# Patient Record
Sex: Female | Born: 1965 | Race: White | Hispanic: No | Smoking: Never smoker
Health system: Southern US, Community
[De-identification: ages and names within clinical notes are randomized; demographics above are authoritative.]

## PROBLEM LIST (undated history)

## (undated) DIAGNOSIS — N3281 Overactive bladder: Secondary | ICD-10-CM

## (undated) DIAGNOSIS — Z5189 Encounter for other specified aftercare: Secondary | ICD-10-CM

## (undated) DIAGNOSIS — I10 Essential (primary) hypertension: Secondary | ICD-10-CM

## (undated) DIAGNOSIS — K5792 Diverticulitis of intestine, part unspecified, without perforation or abscess without bleeding: Secondary | ICD-10-CM

## (undated) DIAGNOSIS — R000 Tachycardia, unspecified: Secondary | ICD-10-CM

## (undated) DIAGNOSIS — Z9109 Other allergy status, other than to drugs and biological substances: Secondary | ICD-10-CM

## (undated) DIAGNOSIS — G43109 Migraine with aura, not intractable, without status migrainosus: Secondary | ICD-10-CM

## (undated) HISTORY — DX: Essential (primary) hypertension: I10

## (undated) HISTORY — DX: Migraine with aura, not intractable, without status migrainosus: G43.109

## (undated) HISTORY — DX: Tachycardia, unspecified: R00.0

## (undated) HISTORY — DX: Other allergy status, other than to drugs and biological substances: Z91.09

## (undated) HISTORY — DX: Encounter for other specified aftercare: Z51.89

## (undated) HISTORY — DX: Overactive bladder: N32.81

## (undated) HISTORY — DX: Diverticulitis of intestine, part unspecified, without perforation or abscess without bleeding: K57.92

## (undated) HISTORY — PX: NASAL SINUS SURGERY: SHX719

---

## 1999-05-30 ENCOUNTER — Other Ambulatory Visit: Admission: RE | Admit: 1999-05-30 | Discharge: 1999-05-30 | Payer: Self-pay | Admitting: Obstetrics and Gynecology

## 2000-06-19 ENCOUNTER — Other Ambulatory Visit: Admission: RE | Admit: 2000-06-19 | Discharge: 2000-06-19 | Payer: Self-pay | Admitting: Obstetrics and Gynecology

## 2001-07-09 ENCOUNTER — Other Ambulatory Visit: Admission: RE | Admit: 2001-07-09 | Discharge: 2001-07-09 | Payer: Self-pay | Admitting: Obstetrics and Gynecology

## 2002-02-11 ENCOUNTER — Encounter: Payer: Self-pay | Admitting: Family Medicine

## 2002-02-11 ENCOUNTER — Ambulatory Visit (HOSPITAL_COMMUNITY): Admission: RE | Admit: 2002-02-11 | Discharge: 2002-02-11 | Payer: Self-pay | Admitting: Family Medicine

## 2002-07-22 ENCOUNTER — Other Ambulatory Visit: Admission: RE | Admit: 2002-07-22 | Discharge: 2002-07-22 | Payer: Self-pay | Admitting: Obstetrics and Gynecology

## 2003-07-27 ENCOUNTER — Other Ambulatory Visit: Admission: RE | Admit: 2003-07-27 | Discharge: 2003-07-27 | Payer: Self-pay | Admitting: Obstetrics and Gynecology

## 2004-08-22 ENCOUNTER — Other Ambulatory Visit: Admission: RE | Admit: 2004-08-22 | Discharge: 2004-08-22 | Payer: Self-pay | Admitting: Obstetrics and Gynecology

## 2005-08-28 ENCOUNTER — Other Ambulatory Visit: Admission: RE | Admit: 2005-08-28 | Discharge: 2005-08-28 | Payer: Self-pay | Admitting: Obstetrics and Gynecology

## 2006-11-01 ENCOUNTER — Other Ambulatory Visit: Admission: RE | Admit: 2006-11-01 | Discharge: 2006-11-01 | Payer: Self-pay | Admitting: Obstetrics and Gynecology

## 2007-11-24 ENCOUNTER — Other Ambulatory Visit: Admission: RE | Admit: 2007-11-24 | Discharge: 2007-11-24 | Payer: Self-pay | Admitting: Obstetrics and Gynecology

## 2009-01-05 ENCOUNTER — Other Ambulatory Visit: Admission: RE | Admit: 2009-01-05 | Discharge: 2009-01-05 | Payer: Self-pay | Admitting: Obstetrics and Gynecology

## 2009-02-04 ENCOUNTER — Encounter: Admission: RE | Admit: 2009-02-04 | Discharge: 2009-02-04 | Payer: Self-pay | Admitting: Obstetrics and Gynecology

## 2010-04-21 DIAGNOSIS — Z5189 Encounter for other specified aftercare: Secondary | ICD-10-CM

## 2010-04-21 HISTORY — DX: Encounter for other specified aftercare: Z51.89

## 2010-05-14 ENCOUNTER — Inpatient Hospital Stay (HOSPITAL_COMMUNITY): Admission: AD | Admit: 2010-05-14 | Discharge: 2010-05-17 | Payer: Self-pay | Admitting: Obstetrics and Gynecology

## 2010-05-15 ENCOUNTER — Encounter (INDEPENDENT_AMBULATORY_CARE_PROVIDER_SITE_OTHER): Payer: Self-pay | Admitting: Obstetrics and Gynecology

## 2010-05-23 ENCOUNTER — Ambulatory Visit: Payer: Self-pay | Admitting: Obstetrics and Gynecology

## 2010-05-23 ENCOUNTER — Inpatient Hospital Stay (HOSPITAL_COMMUNITY): Admission: AD | Admit: 2010-05-23 | Discharge: 2010-05-24 | Payer: Self-pay | Admitting: Obstetrics and Gynecology

## 2010-05-23 DIAGNOSIS — O864 Pyrexia of unknown origin following delivery: Secondary | ICD-10-CM

## 2010-11-11 ENCOUNTER — Other Ambulatory Visit: Payer: Self-pay | Admitting: Obstetrics and Gynecology

## 2010-11-11 DIAGNOSIS — Z1231 Encounter for screening mammogram for malignant neoplasm of breast: Secondary | ICD-10-CM

## 2011-01-05 LAB — URINALYSIS, ROUTINE W REFLEX MICROSCOPIC
Bilirubin Urine: NEGATIVE
Glucose, UA: NEGATIVE mg/dL
Ketones, ur: NEGATIVE mg/dL
Nitrite: NEGATIVE
Protein, ur: NEGATIVE mg/dL
Specific Gravity, Urine: 1.01 (ref 1.005–1.030)
Urobilinogen, UA: 0.2 mg/dL (ref 0.0–1.0)
pH: 6 (ref 5.0–8.0)

## 2011-01-05 LAB — URINE MICROSCOPIC-ADD ON

## 2011-01-05 LAB — CBC
HCT: 36.1 % (ref 36.0–46.0)
Hemoglobin: 12.1 g/dL (ref 12.0–15.0)
MCH: 32.1 pg (ref 26.0–34.0)
MCHC: 33.6 g/dL (ref 30.0–36.0)
MCV: 95.6 fL (ref 78.0–100.0)
Platelets: 337 10*3/uL (ref 150–400)
RBC: 3.78 MIL/uL — ABNORMAL LOW (ref 3.87–5.11)
RDW: 13.9 % (ref 11.5–15.5)
WBC: 29.6 10*3/uL — ABNORMAL HIGH (ref 4.0–10.5)

## 2011-01-06 LAB — CBC
HCT: 30.3 % — ABNORMAL LOW (ref 36.0–46.0)
Hemoglobin: 10.4 g/dL — ABNORMAL LOW (ref 12.0–15.0)
MCH: 33.2 pg (ref 26.0–34.0)
MCHC: 33.8 g/dL (ref 30.0–36.0)
MCHC: 34.4 g/dL (ref 30.0–36.0)
MCV: 96.4 fL (ref 78.0–100.0)
RBC: 3.15 MIL/uL — ABNORMAL LOW (ref 3.87–5.11)

## 2011-02-06 ENCOUNTER — Ambulatory Visit: Payer: Self-pay

## 2012-04-01 ENCOUNTER — Other Ambulatory Visit: Payer: Self-pay | Admitting: Obstetrics and Gynecology

## 2012-04-01 DIAGNOSIS — Z1231 Encounter for screening mammogram for malignant neoplasm of breast: Secondary | ICD-10-CM

## 2012-06-22 DIAGNOSIS — G43009 Migraine without aura, not intractable, without status migrainosus: Secondary | ICD-10-CM | POA: Insufficient documentation

## 2012-07-09 ENCOUNTER — Ambulatory Visit
Admission: RE | Admit: 2012-07-09 | Discharge: 2012-07-09 | Disposition: A | Discharge status: Home / Self Care | Payer: BC Managed Care – PPO | Source: Ambulatory Visit | Attending: Obstetrics and Gynecology | Admitting: Obstetrics and Gynecology

## 2012-07-09 DIAGNOSIS — Z1231 Encounter for screening mammogram for malignant neoplasm of breast: Secondary | ICD-10-CM

## 2013-02-18 ENCOUNTER — Other Ambulatory Visit (HOSPITAL_COMMUNITY): Payer: Self-pay | Admitting: Physician Assistant

## 2013-02-18 DIAGNOSIS — R109 Unspecified abdominal pain: Secondary | ICD-10-CM

## 2013-02-19 ENCOUNTER — Ambulatory Visit (HOSPITAL_COMMUNITY)
Admission: RE | Admit: 2013-02-19 | Discharge: 2013-02-19 | Disposition: A | Discharge status: Home / Self Care | Payer: BC Managed Care – PPO | Source: Ambulatory Visit | Attending: Physician Assistant | Admitting: Physician Assistant

## 2013-02-19 DIAGNOSIS — R109 Unspecified abdominal pain: Secondary | ICD-10-CM

## 2013-06-04 ENCOUNTER — Other Ambulatory Visit: Payer: Self-pay

## 2013-06-04 DIAGNOSIS — Z1231 Encounter for screening mammogram for malignant neoplasm of breast: Secondary | ICD-10-CM

## 2013-07-06 ENCOUNTER — Encounter: Payer: Self-pay | Admitting: Obstetrics and Gynecology

## 2013-07-10 ENCOUNTER — Ambulatory Visit: Payer: Self-pay | Admitting: Obstetrics and Gynecology

## 2013-07-10 ENCOUNTER — Encounter: Payer: Self-pay | Admitting: Gynecology

## 2013-07-10 ENCOUNTER — Ambulatory Visit (INDEPENDENT_AMBULATORY_CARE_PROVIDER_SITE_OTHER): Payer: BC Managed Care – PPO | Admitting: Gynecology

## 2013-07-10 ENCOUNTER — Ambulatory Visit
Admission: RE | Admit: 2013-07-10 | Discharge: 2013-07-10 | Disposition: A | Discharge status: Home / Self Care | Payer: BC Managed Care – PPO | Source: Ambulatory Visit

## 2013-07-10 VITALS — BP 112/70 | HR 86 | Resp 18 | Ht 63.0 in | Wt 139.0 lb

## 2013-07-10 DIAGNOSIS — Z01419 Encounter for gynecological examination (general) (routine) without abnormal findings: Secondary | ICD-10-CM

## 2013-07-10 DIAGNOSIS — Z1231 Encounter for screening mammogram for malignant neoplasm of breast: Secondary | ICD-10-CM

## 2013-07-10 DIAGNOSIS — Z124 Encounter for screening for malignant neoplasm of cervix: Secondary | ICD-10-CM

## 2013-07-10 DIAGNOSIS — Z309 Encounter for contraceptive management, unspecified: Secondary | ICD-10-CM

## 2013-07-10 DIAGNOSIS — K5732 Diverticulitis of large intestine without perforation or abscess without bleeding: Secondary | ICD-10-CM

## 2013-07-10 DIAGNOSIS — G43109 Migraine with aura, not intractable, without status migrainosus: Secondary | ICD-10-CM | POA: Insufficient documentation

## 2013-07-10 DIAGNOSIS — Z0000 Encounter for general adult medical examination without abnormal findings: Secondary | ICD-10-CM

## 2013-07-10 LAB — POCT URINALYSIS DIPSTICK
Leukocytes, UA: NEGATIVE
pH, UA: 5

## 2013-07-10 MED ORDER — NORETHINDRONE 0.35 MG PO TABS: 1.0000 | ORAL_TABLET | Freq: Every day | ORAL | Status: DC

## 2013-07-10 NOTE — Patient Instructions (Signed)

## 2013-07-10 NOTE — Progress Notes (Signed)
47 y.o. Divorced Caucasian female   G1P1001 here for annual exam. Pt is currently sexually active.  Pt denies any dyspareunia. Pt is on POP due to migraines with aura, working well.  Pt does not have menses.  Pt does not have hot flashes or vaginal dryness.  No LMP recorded. Patient is not currently having periods (Reason: Oral contraceptives).          Sexually active: yes  The current method of family planning is OCP (estrogen/progesterone).    Exercising: no  The patient does not participate in regular exercise at present. Last pap: 01/05/09- Negative  Alcohol: 3-5 drinks/wk (wine) Tobacco: no BSE: yes Mammogram: today  Hgb: [facility] ; Urine:  Negative    Health Maintenance  Topic Date Due  . Pap Smear  01/06/2012  . Influenza Vaccine  05/22/2013  . Tetanus/tdap  10/23/2019    Family History  Problem Relation Age of Onset  . Cancer Mother     urine cancer  . Diabetes Father     Type 2  . Cancer Father     lung  . Prostate cancer Father   . Osteoporosis Maternal Grandmother   . Lung cancer Maternal Grandfather   . Osteoporosis Paternal Grandmother     There are no active problems to display for this patient.   Past Medical History  Diagnosis Date  . Environmental allergies   . Diverticulitis   . Blood transfusion without reported diagnosis 04/2010    Past Surgical History  Procedure Laterality Date  . Cesarean section  2011    Allergies: Review of patient's allergies indicates no active allergies.  Current Outpatient Prescriptions  Medication Sig Dispense Refill  . DULoxetine (CYMBALTA) 30 MG capsule Take 30 mg by mouth daily.      . norethindrone (ERRIN) 0.35 MG tablet Take 1 tablet by mouth daily.      . predniSONE (DELTASONE) 20 MG tablet       . tiZANidine (ZANAFLEX) 4 MG tablet       . zonisamide (ZONEGRAN) 100 MG capsule Take 100 mg by mouth 2 (two) times daily.      . Acetaminophen (TYLENOL PO) Take by mouth.      . Celecoxib (CELEBREX PO) Take by  mouth.       No current facility-administered medications for this visit.    ROS: Pertinent items are noted in HPI.  Exam:    BP 112/70  Pulse 86  Resp 18  Ht 5\' 3"  (1.6 m)  Wt 139 lb (63.05 kg)  BMI 24.63 kg/m2 Weight change: @WEIGHTCHANGE @ Last 3 height recordings:  Ht Readings from Last 3 Encounters:  07/10/13 5\' 3"  (1.6 m)   General appearance: alert, cooperative and appears stated age Head: Normocephalic, without obvious abnormality, atraumatic Neck: no adenopathy, no carotid bruit, no JVD, supple, symmetrical, trachea midline and thyroid not enlarged, symmetric, no tenderness/mass/nodules Lungs: clear to auscultation bilaterally Breasts: normal appearance, no masses or tenderness Heart: regular rate and rhythm, S1, S2 normal, no murmur, click, rub or gallop Abdomen: soft, non-tender; bowel sounds normal; no masses,  no organomegaly Extremities: extremities normal, atraumatic, no cyanosis or edema Skin: Skin color, texture, turgor normal. No rashes or lesions Lymph nodes: Cervical, supraclavicular, and axillary nodes normal. no inguinal nodes palpated Neurologic: Grossly normal   Pelvic: External genitalia:  no lesions              Urethra: normal appearing urethra with no masses, tenderness or lesions  Bartholins and Skenes: normal                 Vagina: normal appearing vagina with normal color and discharge, no lesions              Cervix: normal appearance              Pap taken: yes        Bimanual Exam:  Uterus:  uterus is normal size, shape, consistency and nontender                                      Adnexa:    normal adnexa in size, nontender and no masses                                      Rectovaginal: Confirms                                      Anus:  normal sphincter tone, no lesions  A: well woman no contraindication to continue use of oral contraceptives Contraceptive management     P: mammogram annual pap smear HRHPV counseled  on breast self exam, mammography screening, use and side effects of OCP's, adequate intake of calcium and vitamin D, diet and exercise return annually or prn   An After Visit Summary was printed and given to the patient.

## 2013-10-20 ENCOUNTER — Other Ambulatory Visit: Payer: Self-pay | Admitting: Otolaryngology

## 2014-06-22 ENCOUNTER — Ambulatory Visit
Admission: RE | Admit: 2014-06-22 | Discharge: 2014-06-22 | Disposition: A | Discharge status: Home / Self Care | Payer: BC Managed Care – PPO | Source: Ambulatory Visit | Attending: Allergy and Immunology | Admitting: Allergy and Immunology

## 2014-06-22 ENCOUNTER — Other Ambulatory Visit: Payer: Self-pay | Admitting: Allergy and Immunology

## 2014-06-22 DIAGNOSIS — R0602 Shortness of breath: Secondary | ICD-10-CM

## 2014-07-08 ENCOUNTER — Other Ambulatory Visit: Payer: Self-pay

## 2014-07-08 DIAGNOSIS — Z1231 Encounter for screening mammogram for malignant neoplasm of breast: Secondary | ICD-10-CM

## 2014-07-12 ENCOUNTER — Ambulatory Visit (INDEPENDENT_AMBULATORY_CARE_PROVIDER_SITE_OTHER): Payer: BC Managed Care – PPO | Admitting: Gynecology

## 2014-07-12 ENCOUNTER — Encounter: Payer: Self-pay | Admitting: Gynecology

## 2014-07-12 VITALS — BP 108/68 | HR 84 | Resp 12 | Ht 63.0 in | Wt 146.0 lb

## 2014-07-12 DIAGNOSIS — Z01419 Encounter for gynecological examination (general) (routine) without abnormal findings: Secondary | ICD-10-CM

## 2014-07-12 DIAGNOSIS — Z0000 Encounter for general adult medical examination without abnormal findings: Secondary | ICD-10-CM

## 2014-07-12 DIAGNOSIS — Z3041 Encounter for surveillance of contraceptive pills: Secondary | ICD-10-CM

## 2014-07-12 LAB — POCT URINALYSIS DIPSTICK
LEUKOCYTES UA: NEGATIVE
PH UA: 5
Urobilinogen, UA: NEGATIVE

## 2014-07-12 MED ORDER — NORETHINDRONE 0.35 MG PO TABS
1.0000 | ORAL_TABLET | Freq: Every day | ORAL | Status: DC
Start: 2014-07-12 — End: 2014-12-07

## 2014-07-12 MED ORDER — NORETHINDRONE 0.35 MG PO TABS: 1.0000 | ORAL_TABLET | Freq: Every day | ORAL | Status: DC

## 2014-07-12 NOTE — Progress Notes (Signed)
48 y.o. Divorced Caucasian female   G1P1001 here for annual exam. Pt is currently sexually active.  Pt reports on occassions running out of pill and extreme PMS, some hot flashes. migraines still occuring.  No dyspareunia. Pt reports new onset of hemorrhoids-used otc ointment with some relief.  Patient's last menstrual period was 06/28/2014.          Sexually active: Yes.    The current method of family planning is OCP (estrogen/progesterone).    Exercising: No.  The patient does not participate in regular exercise at present. Last pap: 07/10/13 NEG HR HPV Alcohol: 3-5 drinks/wk Tobacco: no BSE: no Mammogram: 07/13/13 Bi-Rads 1 ; scheduled for 07/30/14   Labs: Work ; Urine: Negative    Health Maintenance  Topic Date Due  . Influenza Vaccine  05/22/2014  . Pap Smear  07/10/2016  . Tetanus/tdap  10/23/2019    Family History  Problem Relation Age of Onset  . Cancer Mother     urine cancer  . Diabetes Father     Type 2  . Cancer Father     lung  . Prostate cancer Father   . Osteoporosis Maternal Grandmother   . Lung cancer Maternal Grandfather   . Osteoporosis Paternal Grandmother     Patient Active Problem List   Diagnosis Date Noted  . Migraine with aura 07/10/2013  . Diverticulitis of colon (without mention of hemorrhage) 07/10/2013    Past Medical History  Diagnosis Date  . Environmental allergies   . Diverticulitis   . Blood transfusion without reported diagnosis 04/2010    Past Surgical History  Procedure Laterality Date  . Cesarean section  2011  . Nasal sinus surgery      Allergies: Review of patient's allergies indicates no known allergies.  Current Outpatient Prescriptions  Medication Sig Dispense Refill  . Acetaminophen (TYLENOL PO) Take by mouth.      . Celecoxib (CELEBREX PO) Take by mouth.      . DULoxetine (CYMBALTA) 30 MG capsule Take 30 mg by mouth daily.      . norethindrone (ERRIN) 0.35 MG tablet Take 1 tablet (0.35 mg total) by mouth daily.   3 Package  3  . predniSONE (DELTASONE) 20 MG tablet       . tiZANidine (ZANAFLEX) 4 MG tablet       . zonisamide (ZONEGRAN) 100 MG capsule Take 100 mg by mouth 2 (two) times daily.       No current facility-administered medications for this visit.    ROS: Pertinent items are noted in HPI.  Exam:    BP 108/68  Pulse 84  Resp 12  Ht  (1.6 m)  Wt 146 lb (66.225 kg)  BMI 25.87 kg/m2  LMP 06/28/2014 Weight change: @ Last 3 height recordings:  Ht Readings from Last 3 Encounters:  07/12/14  (1.6 m)  07/10/13  (1.6 m)   General appearance: alert, cooperative and appears stated age Head: Normocephalic, without obvious abnormality, atraumatic Neck: no adenopathy, no carotid bruit, no JVD, supple, symmetrical, trachea midline and thyroid not enlarged, symmetric, no tenderness/mass/nodules Lungs: clear to auscultation bilaterally Breasts: normal appearance, no masses or tenderness Heart: regular rate and rhythm, S1, S2 normal, no murmur, click, rub or gallop Abdomen: soft, non-tender; bowel sounds normal; no masses,  no organomegaly Extremities: extremities normal, atraumatic, no cyanosis or edema Skin: Skin color, texture, turgor normal. No rashes or lesions Lymph nodes: Cervical, supraclavicular, and axillary nodes normal. no inguinal nodes palpated Neurologic:  Grossly normal   Pelvic: External genitalia:  no lesions              Urethra: normal appearing urethra with no masses, tenderness or lesions              Bartholins and Skenes: Bartholin's, Urethra, Skene's normal                 Vagina: normal appearing vagina with normal color and discharge, no lesions              Cervix: normal appearance              Pap taken: No.        Bimanual Exam:  Uterus:  uterus is normal size, shape, consistency and nontender                                      Adnexa:    normal adnexa in size, nontender and no masses                                       Rectovaginal: Confirms                                      Anus:  normal sphincter tone, no lesions      1. Routine gynecological examination counseled on breast self exam, mammography screening, menopause, adequate intake of calcium and vitamin D, diet and exercise return annually or prn    2. Laboratory examination ordered as part of a routine general medical examination  - POCT Urinalysis Dipstick  3. Encounter for surveillance of contraceptive pills  - norethindrone (ERRIN) 0.35 MG tablet; Take 1 tablet (0.35 mg total) by mouth daily.  Dispense: 1 Package; Refill: 11  An After Visit Summary was printed and given to the patient.

## 2014-07-30 ENCOUNTER — Ambulatory Visit: Payer: BC Managed Care – PPO

## 2014-08-12 ENCOUNTER — Ambulatory Visit
Admission: RE | Admit: 2014-08-12 | Discharge: 2014-08-12 | Disposition: A | Discharge status: Home / Self Care | Payer: BC Managed Care – PPO | Source: Ambulatory Visit

## 2014-08-12 DIAGNOSIS — Z1231 Encounter for screening mammogram for malignant neoplasm of breast: Secondary | ICD-10-CM

## 2014-08-23 ENCOUNTER — Encounter: Payer: Self-pay | Admitting: Gynecology

## 2014-09-22 ENCOUNTER — Telehealth: Payer: Self-pay | Admitting: Gynecology

## 2014-09-22 NOTE — Telephone Encounter (Signed)
Left message regarding upcoming appointment has been canceled and needs to be rescheduled. °

## 2014-11-29 ENCOUNTER — Telehealth: Payer: Self-pay | Admitting: Nurse Practitioner

## 2014-11-29 NOTE — Telephone Encounter (Signed)
Spoke with patient. Patient states that she has stopped taking her POP and has not had any bleeding. Patient states she has been off of birth control for weeks. "I know when I saw Dr.Lathrop last she asked if I have bleeding when I am off of the pill because I will randomly stop taking it. At that time I was but now I am not. I do not know if I need to come in or what. I am not sure if I am going through menopause or not." Advised will need to be seen in office to discuss and have any lab work done that may be needed. Patient is agreeable. Requesting appointment on 2/16. Appointment scheduled for 2/16 at 2:15pm. Patient is agreeable to date and time.  Routing to provider for final review. Patient agreeable to disposition. Will close encounter

## 2014-11-29 NOTE — Telephone Encounter (Signed)
Patient calling requesting to speak with the nurse about "possible testing for menopause."

## 2014-12-07 ENCOUNTER — Ambulatory Visit (INDEPENDENT_AMBULATORY_CARE_PROVIDER_SITE_OTHER): Payer: BLUE CROSS/BLUE SHIELD | Admitting: Nurse Practitioner

## 2014-12-07 ENCOUNTER — Encounter: Payer: Self-pay | Admitting: Nurse Practitioner

## 2014-12-07 VITALS — BP 120/82 | HR 84 | Ht 63.0 in | Wt 146.0 lb

## 2014-12-07 DIAGNOSIS — Z3041 Encounter for surveillance of contraceptive pills: Secondary | ICD-10-CM

## 2014-12-07 MED ORDER — NORETHINDRONE 0.35 MG PO TABS: 1.0000 | ORAL_TABLET | Freq: Every day | ORAL | Status: DC

## 2014-12-07 NOTE — Patient Instructions (Signed)
Continue POP and let us know if prolonged bleeing Report any changes in mood

## 2014-12-07 NOTE — Telephone Encounter (Signed)
Left message to call Pellegrino Kennard at [phone]. 

## 2014-12-07 NOTE — Telephone Encounter (Signed)
Spoke with patient. Patient states that she started her cycle on 2/11. Patient is asking if she needs to keep appointment for today. "I just don't know if I still need the blood work or not. Can I stay on the birth control? Will it cause any harm if I am going into menopause?" Advised patient we recommend that she have follow up after three months with no cycle. Advised can keep appointment today to have labs and speak with [name], FNP about continuing birth control if she would like. Patient is agreeable. Will keep appointment scheduled for today.  Routing to provider for final review. Patient agreeable to disposition. Will close encounter

## 2014-12-07 NOTE — Progress Notes (Signed)
Patient ID: Nancy Brewer, female   DOB: 10/23/1965, 49 y.o.   MRN: [id number] S: This 49 yo WM Fe G1P1 presents with a history of increase in mood changes and some vaso symptoms over the past several months.  She had been on POP for birth control with a history of migraine with aura.  Did well and had amenorrhea for the most part while on POP.    She states she just got 'lazy' about 6 wk's ago and did not go and get RX.  Did not notice a real hormonal change at that time.  Rarely SA so did not consider birth control options.  She then had a normal menses on her own on 12/02/14.  Flow was normal and lasted for 3 days.  She feels more emotional in that she gets tense and snappy more than usual.  She has noted some weight gain and fatigue.  She sought care at PCP and all labs including TSH was normal.  She has so many concerns about HRT.  Also her mother had uterine cancer.  A: Off POP  Regular menses 12/02/14   Mood changes  History of migraine HA's with Aura  Plan: Discussed off POP she has no birth control in place  Discussed that she could go back to history of menorrhagia and dysmenorrhea  With having irregular menses also at risk of endometrial hyperplasia.  She is willing to go back on POP and monitor symptoms until AEX in September.   She may add soy to help her with vaso symptoms  She will add Vit B complex to help her with mood changes   Consult time was 15 minutes face to face.

## 2014-12-07 NOTE — Telephone Encounter (Signed)
Patient has an appointment today at 2:15 with Shirlyn GoltzPatty Grubb. Patient says she started her period and need to talk to a nurse before coming to this appointment.

## 2014-12-12 NOTE — Progress Notes (Signed)
Encounter reviewed by Dr. Supriya Beaston Silva.  

## 2015-04-29 ENCOUNTER — Telehealth: Payer: Self-pay | Admitting: Nurse Practitioner

## 2015-04-29 MED ORDER — NITROFURANTOIN MONOHYD MACRO 100 MG PO CAPS: 100.0000 mg | ORAL_CAPSULE | Freq: Two times a day (BID) | ORAL | Status: DC

## 2015-04-29 NOTE — Telephone Encounter (Signed)
Patient returned call.  She states she has been experiencing an increase in frequency of UTI's with last treatment two months ago at [facility]. Patient states "I am sure I have a UTI, I get them all the time." Patient states that her symptoms started one hour ago and feels spasms at the end of her stream, denies dysuria, but patient states that the dysuria usually occurs a few hours into beginning of symptoms. Denies fevers or flank pain. Reports Macrobid works well for her. Allergies confirmed, no known drug allergies. Patient agreeable to appointment for follow up, requests possible referral to urology for evaluation of ongoing infections.  Advised patient would review with [name], FNP and return call. Patient agreeable.

## 2015-04-29 NOTE — Telephone Encounter (Signed)
Call to patient. Message from [name], FNP given. Patient agreeable and expresses gratitude for treatment prior to the weekend. Patient would like to make appointment to discuss recent frequent infections and plan of care going forward. She requests office visit for 05/13/15, however, Patty not in office. Patient agreeable to office visit with Dr. Edward JollySilva and appointment for 05/13/15 and patient scheduled. Advised to return call if symptoms worsen or any concerns. Patient agreeable.   Routing to provider for final review. Patient agreeable to disposition. Will close encounter.

## 2015-04-29 NOTE — Telephone Encounter (Signed)
Message left to return call to [facility] at [phone].  Patient with hx UTI's in hard copy chart.

## 2015-04-29 NOTE — Telephone Encounter (Signed)
Patient calling with symptoms of a uti. Offered her 4:00 this afternoon with [place] and would not be able to make appointment in time. Wondering if anything can be called in.

## 2015-04-29 NOTE — Telephone Encounter (Signed)
She can have Macrobid 100 mg BID # 14 and recheck urine C&S with a nurse visit in 2 weeks.

## 2015-05-13 ENCOUNTER — Ambulatory Visit (INDEPENDENT_AMBULATORY_CARE_PROVIDER_SITE_OTHER): Payer: BLUE CROSS/BLUE SHIELD | Admitting: Obstetrics and Gynecology

## 2015-05-13 ENCOUNTER — Encounter: Payer: Self-pay | Admitting: Obstetrics and Gynecology

## 2015-05-13 VITALS — BP 132/84 | HR 76 | Resp 16 | Ht 63.0 in | Wt 148.8 lb

## 2015-05-13 DIAGNOSIS — Z87440 Personal history of urinary (tract) infections: Secondary | ICD-10-CM

## 2015-05-13 DIAGNOSIS — R102 Pelvic and perineal pain: Secondary | ICD-10-CM

## 2015-05-13 DIAGNOSIS — N9489 Other specified conditions associated with female genital organs and menstrual cycle: Secondary | ICD-10-CM | POA: Diagnosis not present

## 2015-05-13 LAB — POCT URINALYSIS DIPSTICK
BILIRUBIN UA: NEGATIVE
Blood, UA: NEGATIVE
Glucose, UA: NEGATIVE
Ketones, UA: NEGATIVE
Leukocytes, UA: NEGATIVE
Nitrite, UA: NEGATIVE
Protein, UA: NEGATIVE
UROBILINOGEN UA: NEGATIVE
pH, UA: 5

## 2015-05-13 NOTE — Progress Notes (Signed)
Patient ID: Nancy Brewer, female   DOB: 06-20-1966, 49 y.o.   MRN: [id number] GYNECOLOGY  VISIT   HPI: 49 y.o.   Divorced  Caucasian  female   G1P1001 with No LMP recorded.   here for evaluation of frequent urinary tract infections.  Patient states has had 5-6 UTIs in past year.   Long history of UTIs life long.  Since April 2015 having UTI every 3 months.  Is being seen somewhere for these - going to Urgent Care or sees her NP at work, [employer] in [place].  Has positive cultures, sometimes with resistance to Cipro.   Had UTI 04/29/15 and was treated by phone.  Took Macrobid 100 mg po bid for one week.  Symptoms resolved.   With UTIs feels pressure when bladder is full. Goes for very long periods of time without voiding and then feels feels pain and clenching with voiding which is her clue that she has a UTI.  No hx of stones or pyelonephritis.  No urologic surgery.  UTIs are not necessarily related to intercourse.  No prior urologic evaluation.   Is sexually active but not often.   Partner is older and has 47 year old child. No vaginal dryness.  No menses on Micronor.  Occasional hot flashes.   Has diarrhea with diverticulitis.  Takes Zyfaxan for this when it occurs.  Not certain that her UTIs are related to this.   Urine Dip: Neg  GYNECOLOGIC HISTORY: No LMP recorded. Contraception: Micronor Menopausal hormone therapy: n/a Last mammogram: 08-12-14 Density Cat:C:/Neg:[facility]. Last pap smear: 07-01-13 neg:neg HR HPV        OB History    Gravida Para Term Preterm AB TAB SAB Ectopic Multiple Living   _0 Patient Active Problem List   Diagnosis Date Noted  . Migraine with aura 07/10/2013  . Diverticulitis of colon (without mention of hemorrhage) 07/10/2013  . Atypical migraine 06/22/2012    Past Medical History  Diagnosis Date  . Environmental allergies   . Diverticulitis   . Blood transfusion without reported diagnosis 04/2010     Past Surgical History  Procedure Laterality Date  . Cesarean section  2011  . Nasal sinus surgery      Current Outpatient Prescriptions  Medication Sig Dispense Refill  . DULoxetine (CYMBALTA) 30 MG capsule Take 30 mg by mouth daily.    . naproxen (NAPROSYN) 500 MG tablet Take 500 mg by mouth as needed.     . norethindrone (ERRIN) 0.35 MG tablet Take 1 tablet (0.35 mg total) by mouth daily. 3 Package 2  . rizatriptan (MAXALT) 10 MG tablet Take 10 mg by mouth every 2 (two) hours as needed. (max 3/24 hours)    . tiZANidine (ZANAFLEX) 4 MG tablet     . zonisamide (ZONEGRAN) 100 MG capsule Take 100 mg by mouth 2 (two) times daily.    Marland Kitchen XIFAXAN 550 MG TABS tablet Take 1 tablet by mouth 2 (two) times daily. Takes prn only  0   No current facility-administered medications for this visit.     ALLERGIES: Review of patient's allergies indicates no known allergies.  Family History  Problem Relation Age of Onset  . Cancer Mother     urine cancer  . Diabetes Father     Type 2  . Cancer Father     metastatic lung  . Prostate cancer Father   .  Cancer - Other Father     Squamous Cell of mouth x2  . Osteoporosis Maternal Grandmother   . Lung cancer Maternal Grandfather   . Osteoporosis Paternal Grandmother   . Other      Dad with PDL1 Receptor, which inhibits T-cells (per pt.)    History   Social History  . Marital Status: Divorced    Spouse Name: N/A  . Number of Children: N/A  . Years of Education: N/A   Occupational History  . Not on file.   Social History Main Topics  . Smoking status: Never Smoker   . Smokeless tobacco: Never Used  . Alcohol Use: 1.8 - 3.0 oz/week    3-5 Glasses of wine per week  . Drug Use: No  . Sexual Activity:    Partners: Male    Birth Control/ Protection: Pill   Other Topics Concern  . Not on file   Social History Narrative    ROS:  Pertinent items are noted in HPI.  PHYSICAL EXAMINATION:    BP 132/84 mmHg  Pulse 76  Resp 16   Ht _0  (1.6 m)  Wt 148 lb 12.8 oz (67.495 kg)  BMI 26.37 kg/m2    General appearance: alert, cooperative and appears stated age   Abdomen: soft, non-tender; bowel sounds normal; no masses,  no organomegaly   Pelvic: External genitalia:  no lesions              Urethra:  normal appearing urethra with no masses, tenderness or lesions              Bartholins and Skenes: normal                 Vagina: normal appearing vagina with normal color and discharge, no lesions              Cervix: no lesions            Bimanual Exam:  Uterus:  normal size, contour, position, consistency, mobility, non-tender              Adnexa: normal adnexa and no mass, fullness, tenderness      Chaperone was present for exam.  ASSESSMENT  Recurrent UTIs life long. No evidence of pelvic organ prolapse.  Hx of diverticulitis and diarrhea.  Father with PDL1 receptor - inhibits T cells.  PLAN  Counseled regarding recurrent UTIs.  Discussed urologic evaluation.  Will refer patient to Dr. Kathyrn Lass in [place]. Discussed cranberry as a way to reduce UTIs. Will send UC today for a test of cure.  An After Visit Summary was printed and given to the patient.  ___25___ minutes face to face time of which over 50% was spent in counseling.

## 2015-05-14 LAB — URINE CULTURE
COLONY COUNT: NO GROWTH
Organism ID, Bacteria: NO GROWTH

## 2015-05-16 ENCOUNTER — Telehealth: Payer: Self-pay

## 2015-05-16 NOTE — Telephone Encounter (Signed)
-----   Message from Brook E Amundson C Silva, MD sent at 05/15/2015  9:40 PM EDT ----- Please inform patient of her negative urine culture.  Her infection has been treated.  I did make a referral for her to see Dr. Albertson, urology,  for recurrent UTIs. 

## 2015-05-16 NOTE — Telephone Encounter (Signed)
Called patient to discuss results of urine culture at [phone], LMOVM to call me back.

## 2015-05-19 NOTE — Telephone Encounter (Signed)
Called patient and LMOVM to call me back at #[phone].

## 2015-05-19 NOTE — Telephone Encounter (Signed)
-----   Message from Patton Salles, MD sent at 05/15/2015  9:40 PM EDT ----- Please inform patient of her negative urine culture.  Her infection has been treated.  I did make a referral for her to see Dr. Beverely Pace, urology,  for recurrent UTIs.

## 2015-05-23 NOTE — Telephone Encounter (Signed)
See 05-13-15 result note.

## 2015-05-25 ENCOUNTER — Telehealth: Payer: Self-pay | Admitting: Obstetrics and Gynecology

## 2015-05-25 NOTE — Telephone Encounter (Signed)
Ok to close

## 2015-05-25 NOTE — Telephone Encounter (Signed)
Left voicemail regarding referral appointment. The information is listed below. Should the patient need to cancel or reschedule this appointment, please advise them to call the office they've been referred to in order to reschedule.  [facility]            Dr Wilburn Mylar          07/11/15  am with 930am arrival. Please bring insurance card/photo id/medication list [facility] [address] Phone: [phone]

## 2015-05-25 NOTE — Telephone Encounter (Signed)
Patient returning call. Gave patient referral information as listed.

## 2015-07-18 ENCOUNTER — Ambulatory Visit: Payer: Self-pay | Admitting: Nurse Practitioner

## 2015-07-18 ENCOUNTER — Ambulatory Visit: Payer: BC Managed Care – PPO | Admitting: Gynecology

## 2015-08-22 ENCOUNTER — Other Ambulatory Visit: Payer: Self-pay | Admitting: Nurse Practitioner

## 2015-08-22 NOTE — Telephone Encounter (Signed)
Medication refill request: Deblitane  Last AEX:  07-12-14 Next AEX: 08-26-15 Last MMG (if hormonal medication request): 08-13-14 WNL Refill authorized: Please advise

## 2015-08-26 ENCOUNTER — Ambulatory Visit (INDEPENDENT_AMBULATORY_CARE_PROVIDER_SITE_OTHER): Payer: BLUE CROSS/BLUE SHIELD | Admitting: Nurse Practitioner

## 2015-08-26 ENCOUNTER — Encounter: Payer: Self-pay | Admitting: Nurse Practitioner

## 2015-08-26 VITALS — BP 130/88 | HR 72 | Ht 62.75 in | Wt 147.0 lb

## 2015-08-26 DIAGNOSIS — Z3041 Encounter for surveillance of contraceptive pills: Secondary | ICD-10-CM

## 2015-08-26 DIAGNOSIS — Z0000 Encounter for general adult medical examination without abnormal findings: Secondary | ICD-10-CM | POA: Diagnosis not present

## 2015-08-26 DIAGNOSIS — Z01419 Encounter for gynecological examination (general) (routine) without abnormal findings: Secondary | ICD-10-CM

## 2015-08-26 LAB — POCT URINALYSIS DIPSTICK
Bilirubin, UA: NEGATIVE
Blood, UA: NEGATIVE
Glucose, UA: NEGATIVE
Ketones, UA: NEGATIVE
LEUKOCYTES UA: NEGATIVE
Nitrite, UA: NEGATIVE
PROTEIN UA: NEGATIVE
UROBILINOGEN UA: NEGATIVE
pH, UA: 6

## 2015-08-26 MED ORDER — NORETHINDRONE 0.35 MG PO TABS: 1.0000 | ORAL_TABLET | Freq: Every day | ORAL | Status: DC

## 2015-08-26 NOTE — Patient Instructions (Signed)

## 2015-08-26 NOTE — Progress Notes (Signed)
Patient ID: Nancy Brewer, female   DOB: 03-30-1966, 49 y.o.   MRN: [id number] 49 y.o. G47P1001 Divorced  Caucasian Fe here for annual exam.  Menses are now absent since 12/02/14 on POP.  Her symptoms of PMS and perimenopausal changes are much better now that sh is back on POP.  Same partner for 10 years and rarely SA.  Patient's last menstrual period was 12/02/2014 (exact date).          Sexually active: No.  The current method of family planning is abstinence.    Exercising: No.  The patient does not participate in regular exercise at present. Smoker:  no  Health Maintenance: Pap: 07/10/13, Negative with neg HR HPV MMG: 08/12/14, Bi-Rads 1: Negative  TDaP: 10/22/09 Labs:  PCP   Urine: Negative    reports that she has never smoked. She has never used smokeless tobacco. She reports that she drinks about 1.8 - 3.0 oz of alcohol per week. She reports that she does not use illicit drugs.  Past Medical History  Diagnosis Date  . Environmental allergies   . Diverticulitis   . Blood transfusion without reported diagnosis 04/2010    Past Surgical History  Procedure Laterality Date  . Cesarean section  2011  . Nasal sinus surgery      Current Outpatient Prescriptions  Medication Sig Dispense Refill  . DULoxetine (CYMBALTA) 60 MG capsule Take 60 mg by mouth daily.  0  . naproxen (NAPROSYN) 500 MG tablet Take 500 mg by mouth as needed.     . norethindrone (DEBLITANE) 0.35 MG tablet Take 1 tablet (0.35 mg total) by mouth daily. 3 Package 4  . oxybutynin (DITROPAN) 5 MG tablet Take 1 tablet by mouth 3 (three) times daily as needed.  0  . rizatriptan (MAXALT) 10 MG tablet Take 10 mg by mouth every 2 (two) hours as needed. (max 3/24 hours)    . tiZANidine (ZANAFLEX) 4 MG tablet     . zonisamide (ZONEGRAN) 100 MG capsule Take 100 mg by mouth 2 (two) times daily.    Marland Kitchen XIFAXAN 550 MG TABS tablet Take 1 tablet by mouth 2 (two) times daily. Takes prn only  0   No current facility-administered  medications for this visit.    Family History  Problem Relation Age of Onset  . Cancer Mother     urine cancer  . Diabetes Father     Type 2  . Cancer Father     metastatic lung  . Prostate cancer Father   . Cancer - Other Father     Squamous Cell of mouth x2  . Osteoporosis Maternal Grandmother   . Lung cancer Maternal Grandfather   . Osteoporosis Paternal Grandmother   . Other      Dad with PDL1 Receptor, which inhibits T-cells (per pt.)    ROS:  Pertinent items are noted in HPI.  Otherwise, a comprehensive ROS was negative.  Exam:   BP 130/88 mmHg  Pulse 72  Ht 5' 2.75" (1.594 m)  Wt 147 lb (66.679 kg)  BMI 26.24 kg/m2  LMP 12/02/2014 (Exact Date) Height: 5' 2.75" (159.4 cm) Ht Readings from Last 3 Encounters:  08/26/15 5' 2.75" (1.594 m)  05/13/15 5' 3"  (1.6 m)  12/07/14 5' 3"  (1.6 m)    General appearance: alert, cooperative and appears stated age Head: Normocephalic, without obvious abnormality, atraumatic Neck: no adenopathy, supple, symmetrical, trachea midline and thyroid normal to inspection and palpation Lungs: clear to auscultation bilaterally Breasts: normal  appearance, no masses or tenderness Heart: regular rate and rhythm Abdomen: soft, non-tender; no masses,  no organomegaly Extremities: extremities normal, atraumatic, no cyanosis or edema Skin: Skin color, texture, turgor normal. No rashes or lesions Lymph nodes: Cervical, supraclavicular, and axillary nodes normal. No abnormal inguinal nodes palpated Neurologic: Grossly normal   Pelvic: External genitalia:  no lesions              Urethra:  normal appearing urethra with no masses, tenderness or lesions              Bartholin's and Skene's: normal                 Vagina: normal appearing vagina with normal color and discharge, no lesions              Cervix: anteverted              Pap taken: No. Bimanual Exam:  Uterus:  normal size, contour, position, consistency, mobility, non-tender               Adnexa: no mass, fullness, tenderness               Rectovaginal: Confirms               Anus:  normal sphincter tone, no lesions  Chaperone present: no  A:  Well Woman with normal exam  POP for contraception and menses regulation, current amenorrhea  History of OAB  History of IBS  History of migraine with aura   P:   Reviewed health and wellness pertinent to exam  Pap smear as above  Mammogram is due and she will schedule  Refill on POP for a year  She will continue with Uro follow up in [place].  Counseled on breast self exam, mammography screening, use and side effects of POP's, adequate intake of calcium and vitamin D, diet and exercise, Kegel's exercises return annually or prn  An After Visit Summary was printed and given to the patient.

## 2015-08-29 NOTE — Progress Notes (Signed)
Encounter reviewed by Dr. Tianna Baus Amundson C. Silva.  

## 2015-09-02 ENCOUNTER — Other Ambulatory Visit: Payer: Self-pay

## 2015-09-02 DIAGNOSIS — Z1231 Encounter for screening mammogram for malignant neoplasm of breast: Secondary | ICD-10-CM

## 2015-10-18 ENCOUNTER — Ambulatory Visit
Admission: RE | Admit: 2015-10-18 | Discharge: 2015-10-18 | Disposition: A | Discharge status: Home / Self Care | Payer: BLUE CROSS/BLUE SHIELD | Source: Ambulatory Visit

## 2015-10-18 DIAGNOSIS — Z1231 Encounter for screening mammogram for malignant neoplasm of breast: Secondary | ICD-10-CM

## 2016-08-09 ENCOUNTER — Other Ambulatory Visit: Payer: Self-pay | Admitting: Nurse Practitioner

## 2016-08-09 DIAGNOSIS — Z1231 Encounter for screening mammogram for malignant neoplasm of breast: Secondary | ICD-10-CM

## 2016-08-26 ENCOUNTER — Other Ambulatory Visit: Payer: Self-pay | Admitting: Nurse Practitioner

## 2016-08-27 NOTE — Telephone Encounter (Signed)
Medication refill request: Deblitane Last AEX:  08-26-15  Next AEX: 09-17-16 Last MMG (if hormonal medication request): 10-18-15 WNL Refill authorized: please advise

## 2016-08-29 ENCOUNTER — Ambulatory Visit: Payer: BLUE CROSS/BLUE SHIELD | Admitting: Nurse Practitioner

## 2016-09-17 ENCOUNTER — Encounter: Payer: Self-pay | Admitting: Nurse Practitioner

## 2016-09-17 ENCOUNTER — Ambulatory Visit: Payer: BLUE CROSS/BLUE SHIELD | Admitting: Nurse Practitioner

## 2016-09-18 ENCOUNTER — Encounter: Payer: Self-pay | Admitting: Nurse Practitioner

## 2016-09-18 ENCOUNTER — Ambulatory Visit (INDEPENDENT_AMBULATORY_CARE_PROVIDER_SITE_OTHER): Payer: BLUE CROSS/BLUE SHIELD | Admitting: Nurse Practitioner

## 2016-09-18 VITALS — BP 116/80 | HR 96 | Ht 62.75 in | Wt 157.0 lb

## 2016-09-18 DIAGNOSIS — Z0000 Encounter for general adult medical examination without abnormal findings: Secondary | ICD-10-CM | POA: Diagnosis not present

## 2016-09-18 DIAGNOSIS — Z3041 Encounter for surveillance of contraceptive pills: Secondary | ICD-10-CM | POA: Diagnosis not present

## 2016-09-18 DIAGNOSIS — Z01419 Encounter for gynecological examination (general) (routine) without abnormal findings: Secondary | ICD-10-CM

## 2016-09-18 LAB — POCT URINALYSIS DIPSTICK
BILIRUBIN UA: NEGATIVE
GLUCOSE UA: NEGATIVE
Ketones, UA: NEGATIVE
Leukocytes, UA: NEGATIVE
NITRITE UA: NEGATIVE
Protein, UA: NEGATIVE
RBC UA: 1
Urobilinogen, UA: NEGATIVE
pH, UA: 6

## 2016-09-18 MED ORDER — NORETHINDRONE 0.35 MG PO TABS
1.0000 | ORAL_TABLET | Freq: Every day | ORAL | 4 refills | Status: DC
Start: 2016-09-18 — End: 2017-09-24

## 2016-09-18 NOTE — Patient Instructions (Signed)

## 2016-09-18 NOTE — Progress Notes (Signed)
Patient ID: Nancy Brewer, female   DOB: 11-14-1965, 50 y.o.   MRN: [id number]  50 y.o. G48P1001 Divorced  Caucasian Fe here for annual exam.  On POP will get a brown spotting/ flow for 2-3 days every 2-3 months.  No new health problems. Some vaso symptoms but tolerable.   Same partner without change.  Not SA very often, he is older.  Son is now 5 yrs old.  Patient's last menstrual period was 09/16/2016 (exact date).          Sexually active: Yes.    The current method of family planning is POP (progesterone only).    Exercising: No.  The patient does not participate in regular exercise at present. Smoker:  no  Health Maintenance: Pap: 07/10/13, Negative with neg HR HPV MMG: 10/18/15, Bi-Rads 1: Negative, scheduled for 10/18/16 TDaP: 10/22/09 HIV: 2011 with pregnancy Labs: PCP takes care of screening labs  Urine: 1+ RBC - on menses   reports that she has never smoked. She has never used smokeless tobacco. She reports that she drinks about 1.8 - 3.0 oz of alcohol per week . She reports that she does not use drugs.  Past Medical History:  Diagnosis Date  . Blood transfusion without reported diagnosis 04/2010  . Diverticulitis   . Environmental allergies   . Migraine headache with aura   . OAB (overactive bladder)     Past Surgical History:  Procedure Laterality Date  . CESAREAN SECTION  2011  . NASAL SINUS SURGERY      Current Outpatient Prescriptions  Medication Sig Dispense Refill  . Botulinum Toxin Type A (BOTOX) 200 units SOLR INJECT UP TO 200 UNITS INTRAMUSCULARLY BY PROVIDER TO HEAD, NECK AND SHOULDER EVERY 3 MONTHS    . DEBLITANE 0.35 MG tablet take 1 tablet by mouth once daily 84 tablet 0  . DULoxetine (CYMBALTA) 60 MG capsule Take 60 mg by mouth daily.  0  . naproxen (NAPROSYN) 500 MG tablet Take 500 mg by mouth as needed.     . rizatriptan (MAXALT) 10 MG tablet Take 10 mg by mouth every 2 (two) hours as needed. (max 3/24 hours)    . tiZANidine (ZANAFLEX) 4 MG tablet      . zonisamide (ZONEGRAN) 100 MG capsule Take 100 mg by mouth 2 (two) times daily.    Marland Kitchen oxybutynin (DITROPAN) 5 MG tablet Take 1 tablet by mouth 3 (three) times daily as needed.  0   No current facility-administered medications for this visit.     Family History  Problem Relation Age of Onset  . Cancer Mother     urine cancer  . Diabetes Father     Type 2  . Cancer Father     metastatic lung  . Prostate cancer Father   . Cancer - Other Father     Squamous Cell of mouth x2  . Heart disease Father     open heart hurgery 2017  . Osteoporosis Maternal Grandmother   . Lung cancer Maternal Grandfather   . Osteoporosis Paternal Grandmother   . Other      Dad with PDL1 Receptor, which inhibits T-cells (per pt.)    ROS:  Pertinent items are noted in HPI.  Otherwise, a comprehensive ROS was negative.  Exam:   BP 116/80 (BP Location: Right Arm, Patient Position: Sitting, Cuff Size: Large)   Pulse 96   Ht 5' 2.75" (1.594 m)   Wt 157 lb (71.2 kg)   LMP 09/16/2016 (Exact Date)  BMI 28.03 kg/m  Height: 5' 2.75" (159.4 cm) Ht Readings from Last 3 Encounters:  09/18/16 5' 2.75" (1.594 m)  08/26/15 5' 2.75" (1.594 m)  05/13/15 5' 3"  (1.6 m)    General appearance: alert, cooperative and appears stated age Head: Normocephalic, without obvious abnormality, atraumatic Neck: no adenopathy, supple, symmetrical, trachea midline and thyroid normal to inspection and palpation Lungs: clear to auscultation bilaterally Breasts: normal appearance, no masses or tenderness Heart: regular rate and rhythm Abdomen: soft, non-tender; no masses,  no organomegaly Extremities: extremities normal, atraumatic, no cyanosis or edema Skin: Skin color, texture, turgor normal. No rashes or lesions Lymph nodes: Cervical, supraclavicular, and axillary nodes normal. No abnormal inguinal nodes palpated Neurologic: Grossly normal   Pelvic: External genitalia:  no lesions              Urethra:  normal appearing  urethra with no masses, tenderness or lesions              Bartholin's and Skene's: normal                 Vagina: normal appearing vagina with normal color and discharge, no lesions              Cervix: anteverted              Pap taken: Yes.   Bimanual Exam:  Uterus:  normal size, contour, position, consistency, mobility, non-tender              Adnexa: no mass, fullness, tenderness               Rectovaginal: Confirms               Anus:  normal sphincter tone, no lesions  Chaperone present: yes  A:  Well Woman with normal exam  POP for contraception and menses regulation, current amenorrhea with irregular cycles             History of OAB - followed by Uro in [place]             History of IBS             History of migraine with aura    P:   Reviewed health and wellness pertinent to exam  Pap smear as above  Mammogram is due 12/17  Refill on POP for a year  Counseled on breast self exam, mammography screening, use and side effects of POP's, adequate intake of calcium and vitamin D, diet and exercise return annually or prn  An After Visit Summary was printed and given to the patient.

## 2016-09-20 LAB — IPS PAP TEST WITH HPV

## 2016-09-20 NOTE — Progress Notes (Signed)
Reviewed personally.  M. Suzanne Jalin Alicea, MD.  

## 2016-09-24 ENCOUNTER — Telehealth: Payer: Self-pay | Admitting: *Deleted

## 2016-09-24 NOTE — Telephone Encounter (Signed)
Spoke with patient, advised of results as seen below per Ria CommentPatricia Grubb, NP. Patient states she would like to repeat pap in a few weeks if possible due to cycle being close to exam. Patient states father had cancer 9 times and mother once, she does not want to take any chances. Advised patient would review with Ria CommentPatricia Grubb, NP and return call with recommendations.  Ria CommentPatricia Grubb, NP -please advise?

## 2016-09-24 NOTE — Telephone Encounter (Signed)
-----   Message from Ria CommentPatricia Grubb, FNP sent at 09/24/2016  8:11 AM EST ----- Please let pt know that Pap is slight abnormal showing ASCUS but the HR HPV test was negative. Endo cells present but LMP 09/16/16. No prior hx of abnormal.  Recommendations are to repeat co testing in 3 yrs.

## 2016-09-24 NOTE — Telephone Encounter (Signed)
Patient returning your call.  Says all of her fathers cancers were squamous

## 2016-09-25 ENCOUNTER — Encounter: Payer: Self-pay | Admitting: Nurse Practitioner

## 2016-09-25 NOTE — Telephone Encounter (Signed)
See e-mail per Dr. Hyacinth MeekerMiller and she also called her and left a clear message for her about pap.

## 2016-09-25 NOTE — Telephone Encounter (Signed)
Routing to provider for final review. Will close encounter.     

## 2016-09-26 ENCOUNTER — Encounter: Payer: Self-pay | Admitting: Obstetrics & Gynecology

## 2016-10-18 ENCOUNTER — Ambulatory Visit
Admission: RE | Admit: 2016-10-18 | Discharge: 2016-10-18 | Disposition: A | Discharge status: Home / Self Care | Payer: BLUE CROSS/BLUE SHIELD | Source: Ambulatory Visit | Attending: Nurse Practitioner | Admitting: Nurse Practitioner

## 2016-10-18 DIAGNOSIS — Z1231 Encounter for screening mammogram for malignant neoplasm of breast: Secondary | ICD-10-CM

## 2016-10-22 HISTORY — PX: LASIK: SHX215

## 2017-05-03 ENCOUNTER — Telehealth: Payer: Self-pay | Admitting: Obstetrics and Gynecology

## 2017-05-03 NOTE — Telephone Encounter (Signed)
Patient called and requested to speak with the nurse. She said she takes birth control continuously and normally doesn't have a menstrual cycle but right now she is having "dark brown blood that doesn't look normal."   Last seen: 09/18/16   AEX with Ria CommentPatricia Grubb, FNP rescheduled to Dr. Rica RecordsSilva's schedule on 09/23/17. FYI only.

## 2017-05-03 NOTE — Telephone Encounter (Signed)
Please have patient monitor for spotting.  If it persists, I would like to have her come in for a visit with me.  For now, she can just do observational management.

## 2017-05-03 NOTE — Telephone Encounter (Signed)
Spoke with patient. Reports has been on POP, takes continuously, does not allow for menses. Reports spotting two times in the last 2 weeks dark brown blood, possibly some mucous. Denies pain, urinary complaints or odor. Has not been sexually active in several months, no STD concerns. Has reported spotting in the past for 2-3 days, nothing recently. Advised patient would review with Dr. Edward JollySilva and return call with recommendations, patient is agreeable.   Dr. Edward JollySilva, please review and advise?

## 2017-05-03 NOTE — Telephone Encounter (Signed)
Left detailed message, ok per current dpr. Advised as seen below per Dr. Silva. AdviseEdward Jollyd to return call to office at [phone] for any additional questions.   Will close encounter.

## 2017-09-23 ENCOUNTER — Ambulatory Visit: Payer: BLUE CROSS/BLUE SHIELD | Admitting: Nurse Practitioner

## 2017-09-24 ENCOUNTER — Other Ambulatory Visit: Payer: Self-pay

## 2017-09-24 NOTE — Telephone Encounter (Signed)
Medication refill request: OCP Last AEX:  09/18/16 PG Next AEX: 09/24/17 BS Last MMG (if hormonal medication request): 10/18/16 BIRADS 1 negative.density c Refill authorized: 09/18/16 #84 w/4 refills; today please advise, Dr. Edward JollySilva out of office 09/24/17

## 2017-09-25 ENCOUNTER — Other Ambulatory Visit: Payer: Self-pay

## 2017-09-25 ENCOUNTER — Encounter: Payer: Self-pay | Admitting: Obstetrics and Gynecology

## 2017-09-25 ENCOUNTER — Ambulatory Visit (INDEPENDENT_AMBULATORY_CARE_PROVIDER_SITE_OTHER): Payer: BLUE CROSS/BLUE SHIELD | Admitting: Obstetrics and Gynecology

## 2017-09-25 VITALS — BP 122/84 | HR 80 | Resp 16 | Ht 63.0 in | Wt 157.6 lb

## 2017-09-25 DIAGNOSIS — Z01419 Encounter for gynecological examination (general) (routine) without abnormal findings: Secondary | ICD-10-CM | POA: Diagnosis not present

## 2017-09-25 MED ORDER — NORETHINDRONE 0.35 MG PO TABS: 1.0000 | ORAL_TABLET | Freq: Every day | ORAL | 3 refills | Status: DC

## 2017-09-25 MED ORDER — NORETHINDRONE 0.35 MG PO TABS: 1.0000 | ORAL_TABLET | Freq: Every day | ORAL | 4 refills | Status: DC

## 2017-09-25 NOTE — Patient Instructions (Signed)

## 2017-09-25 NOTE — Progress Notes (Signed)
51 y.o. G31P1001 Divorced Caucasian female here for annual exam.    Rare spotting.  On Micronor for pregnancy prevention and mood control. Can have a more normal cycle once a year.  Some hot flashes.   Not taking Ditropan.   Rare sexual activity.  No new partner.  Stable relationship.   Mother had uterine cancer.   Labs with PCP.   Has a 49 year old son.  Conceived through artificial semination.   PCP:  [facility]   Patient's last menstrual period was 06/22/2017 (approximate).     Period Cycle (Days): (continuous OCPs)     Sexually active: Yes.   female The current method of family planning is oral progesterone-only contraceptive--Micronor.    Exercising: No.   Smoker:  no  Health Maintenance: Pap: 09-18-16 ASCUS:Neg HR HPV.  Endometrial cells noted. LMP was 09/16/17.  History of abnormal Pap:  no MMG: 10-18-16 Density C/Neg/BiRads1:TBC Colonoscopy:  n/a BMD:   n/a  Result  n/a TDaP:  2010 Gardasil:   no HIV: Never Hep C: Never Screening Labs:  Hb today: PCP, Urine today: not done Flu vaccine done at work this year.    reports that  has never smoked. she has never used smokeless tobacco. She reports that she drinks about 3.0 - 4.2 oz of alcohol per week. She reports that she does not use drugs.  Past Medical History:  Diagnosis Date  . Blood transfusion without reported diagnosis 04/2010  . Diverticulitis   . Environmental allergies   . Migraine headache with aura   . OAB (overactive bladder)     Past Surgical History:  Procedure Laterality Date  . CESAREAN SECTION  2011  . LASIK Bilateral 2018  . NASAL SINUS SURGERY      Current Outpatient Medications  Medication Sig Dispense Refill  . DULoxetine (CYMBALTA) 60 MG capsule Take 60 mg by mouth daily.  0  . Erenumab-aooe 70 MG/ML SOAJ Inject 140 mg into the skin every 30 (thirty) days.    . Fluticasone-Salmeterol (ADVAIR DISKUS) 100-50 MCG/DOSE AEPB Inhale into the lungs.    . naproxen (NAPROSYN) 500  MG tablet Take 500 mg by mouth as needed.     . norethindrone (DEBLITANE) 0.35 MG tablet Take 1 tablet (0.35 mg total) by mouth daily. 84 tablet 3  . tiZANidine (ZANAFLEX) 4 MG tablet     . zonisamide (ZONEGRAN) 100 MG capsule Take 100 mg by mouth 2 (two) times daily.    Marland Kitchen oxybutynin (DITROPAN) 5 MG tablet Take 1 tablet by mouth 3 (three) times daily as needed.  0   No current facility-administered medications for this visit.     Family History  Problem Relation Age of Onset  . Cancer Mother        uterine cancer  . Diabetes Father        Type 2  . Cancer Father        metastatic lung  . Prostate cancer Father   . Cancer - Other Father        Squamous Cell of mouth x2  . Heart disease Father        open heart hurgery 2017  . Heart attack Father   . Osteoporosis Maternal Grandmother   . Lung cancer Maternal Grandfather   . Osteoporosis Paternal Grandmother   . Other Unknown        Dad with PDL1 Receptor, which inhibits T-cells (per pt.)    ROS:  Pertinent items are noted in HPI.  Otherwise, a comprehensive ROS was negative.  Exam:   BP 122/84 (BP Location: Right Arm, Patient Position: Sitting, Cuff Size: Normal)   Pulse 80   Resp 16   Ht 5' 3"  (1.6 m)   Wt 157 lb 9.6 oz (71.5 kg)   LMP 06/22/2017 (Approximate)   BMI 27.92 kg/m     General appearance: alert, cooperative and appears stated age Head: Normocephalic, without obvious abnormality, atraumatic Neck: no adenopathy, supple, symmetrical, trachea midline and thyroid normal to inspection and palpation Lungs: clear to auscultation bilaterally Breasts: normal appearance, no masses or tenderness, No nipple retraction or dimpling, No nipple discharge or bleeding, No axillary or supraclavicular adenopathy Heart: regular rate and rhythm Abdomen: soft, non-tender; no masses, no organomegaly Extremities: extremities normal, atraumatic, no cyanosis or edema Skin: Skin color, texture, turgor normal. No rashes or  lesions Lymph nodes: Cervical, supraclavicular, and axillary nodes normal. No abnormal inguinal nodes palpated Neurologic: Grossly normal  Pelvic: External genitalia:  no lesions              Urethra:  normal appearing urethra with no masses, tenderness or lesions              Bartholins and Skenes: normal                 Vagina: normal appearing vagina with normal color and discharge, no lesions              Cervix: no lesions              Pap taken: No. Bimanual Exam:  Uterus:  normal size, contour, position, consistency, mobility, non-tender              Adnexa: no mass, fullness, tenderness              Rectal exam: Yes.  .  Confirms.              Anus:  normal sphincter tone, no lesions  Chaperone was present for exam.  Assessment:   Well woman visit with normal exam. Last pap ASCUS and negative HR HPV 2017. FH uterine cancer.  Hx overactive bladder.  Hx IBS.  Hx migraine with aura.   Plan: Mammogram screening discussed.  Discussed 3D. Recommended self breast awareness. Pap and HR HPV in 2020.  Guidelines for Calcium, Vitamin D, regular exercise program including cardiovascular and weight bearing exercise. Discussed colonoscopy.  Refill of Micronor for one year. Follow up annually and prn.   After visit summary provided.

## 2017-11-18 ENCOUNTER — Other Ambulatory Visit: Payer: Self-pay | Admitting: Obstetrics and Gynecology

## 2017-11-18 DIAGNOSIS — Z1231 Encounter for screening mammogram for malignant neoplasm of breast: Secondary | ICD-10-CM

## 2017-12-06 ENCOUNTER — Ambulatory Visit: Payer: BLUE CROSS/BLUE SHIELD

## 2017-12-20 ENCOUNTER — Ambulatory Visit
Admission: RE | Admit: 2017-12-20 | Discharge: 2017-12-20 | Disposition: A | Discharge status: Home / Self Care | Payer: BLUE CROSS/BLUE SHIELD | Source: Ambulatory Visit | Attending: Obstetrics and Gynecology | Admitting: Obstetrics and Gynecology

## 2017-12-20 DIAGNOSIS — Z1231 Encounter for screening mammogram for malignant neoplasm of breast: Secondary | ICD-10-CM

## 2018-05-02 ENCOUNTER — Ambulatory Visit (INDEPENDENT_AMBULATORY_CARE_PROVIDER_SITE_OTHER): Payer: BLUE CROSS/BLUE SHIELD

## 2018-05-02 ENCOUNTER — Ambulatory Visit: Payer: BLUE CROSS/BLUE SHIELD | Admitting: Podiatry

## 2018-05-02 ENCOUNTER — Encounter: Payer: Self-pay | Admitting: Podiatry

## 2018-05-02 ENCOUNTER — Encounter

## 2018-05-02 DIAGNOSIS — M722 Plantar fascial fibromatosis: Secondary | ICD-10-CM

## 2018-05-02 MED ORDER — TRIAMCINOLONE ACETONIDE 10 MG/ML IJ SUSP
10.0000 mg | Freq: Once | INTRAMUSCULAR | Status: AC
  Administered 2018-05-02: 10 mg

## 2018-05-02 MED ORDER — DICLOFENAC SODIUM 75 MG PO TBEC: 75.0000 mg | DELAYED_RELEASE_TABLET | Freq: Two times a day (BID) | ORAL | 2 refills | Status: DC

## 2018-05-02 NOTE — Progress Notes (Signed)
Subjective:   Patient ID: Allison QuarryKimberly E Pesch, female   DOB: 52 y.o.   MRN: [id number]   HPI Patient states she is developing significant pain in the bottom of her left heel of several months duration with worsening over the last couple weeks and she is getting ready to go [place] and run home and is desperate to be able to walk.  Patient does not smoke   Review of Systems  All other systems reviewed and are negative.       Objective:  Physical Exam  Constitutional: She appears well-developed and well-nourished.  Cardiovascular: Intact distal pulses.  Pulmonary/Chest: Effort normal.  Musculoskeletal: Normal range of motion.  Neurological: She is alert.  Skin: Skin is warm.  Nursing note and vitals reviewed.   Neurovascular status intact muscle strength is adequate range of motion within normal limits with patient found to have exquisite discomfort plantar aspect left heel at the insertional point tendon into the calcaneus with inflammation fluid buildup and is noted to have moderate hallux limitus deformity left.  Patient has good digital perfusion well oriented x3     Assessment:  Acute plantar fasciitis left with inflammation fluid around the band with mild functional hallux limitus deformity left     Plan:  H&P condition reviewed and today injected the plantar fascial left 3 mg Kenalog 5 mg Xylocaine and applied fascial brace to the left foot.  Gave instructions on physical therapy support and reappoint 4 weeks when she returns from her trip and also placed on diclofenac 75 mg twice daily  X-rays indicate there is spur formation elevation of the first metatarsal segment but no indication stress fracture

## 2018-05-02 NOTE — Patient Instructions (Signed)

## 2018-05-28 ENCOUNTER — Ambulatory Visit: Payer: BLUE CROSS/BLUE SHIELD | Admitting: Podiatry

## 2018-06-02 ENCOUNTER — Ambulatory Visit: Payer: BLUE CROSS/BLUE SHIELD | Admitting: Podiatry

## 2018-10-04 ENCOUNTER — Other Ambulatory Visit: Payer: Self-pay | Admitting: Obstetrics & Gynecology

## 2018-10-10 ENCOUNTER — Ambulatory Visit: Payer: BLUE CROSS/BLUE SHIELD | Admitting: Obstetrics and Gynecology

## 2018-10-17 ENCOUNTER — Other Ambulatory Visit: Payer: Self-pay

## 2018-10-17 ENCOUNTER — Ambulatory Visit: Payer: BLUE CROSS/BLUE SHIELD | Admitting: Obstetrics and Gynecology

## 2018-10-17 ENCOUNTER — Encounter: Payer: Self-pay | Admitting: Obstetrics and Gynecology

## 2018-10-17 VITALS — BP 118/66 | HR 90 | Resp 18 | Ht 62.5 in | Wt 149.4 lb

## 2018-10-17 DIAGNOSIS — Z01419 Encounter for gynecological examination (general) (routine) without abnormal findings: Secondary | ICD-10-CM | POA: Diagnosis not present

## 2018-10-17 DIAGNOSIS — N951 Menopausal and female climacteric states: Secondary | ICD-10-CM

## 2018-10-17 NOTE — Patient Instructions (Signed)

## 2018-10-17 NOTE — Telephone Encounter (Signed)
Patient seen in office today 10/17/18. [facility]  Medication refill request: NORLYDA Last AEX:  10/17/18 BS Next AEX: 11/04/19 Last MMG (if hormonal medication request): 12/20/17 BIRADS 1 negative/density c Refill authorized: 09/25/17 #84 w/3 refills; today please advise

## 2018-10-17 NOTE — Progress Notes (Signed)
52 y.o. G16P1001 Divorced Caucasian female here for annual exam.    Saw a cardiologist for increased heart rate.  Told she has HTN.  Did Holter monitoring.   No menses.  On POPs.  Ran out about a week ago.  Forgets every now and then.  No vaginal bleeding.  Not sexually active very often.  Not having as many hot flashes since starting Amlodipine.  Stopped Cymbalta and is now on Wellbutrin.  Bladder is doing well.   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  No partner change for 10 years.  Did donor sperm for pregnancy.   PCP:  Thalia Party   Patient's last menstrual period was 06/22/2017 (approximate).           Sexually active: No.  The current method of family planning is oral progesterone-only contraceptive.    Exercising: Yes.    works out at gym Smoker:  no  Health Maintenance: Pap: 09-18-16 ASCUS:Neg HR HPV, 07-10-13 Neg:Neg HR HPV History of abnormal Pap:  no MMG:  12-20-17 3D Neg/density C/BiRads1 Colonoscopy:  NEVER--PCP suggested cologuard BMD:   n/a  Result  n/a TDaP:  2010 Gardasil:   no HIV:no Hep C:no Screening Labs:  Hb today: Labs routinely at work. Flu vaccine done.    reports that she has never smoked. She has never used smokeless tobacco. She reports current alcohol use of about 5.0 - 7.0 standard drinks of alcohol per week. She reports that she does not use drugs.  Past Medical History:  Diagnosis Date  . Blood transfusion without reported diagnosis 04/2010  . Diverticulitis   . Environmental allergies   . Hypertension   . Migraine headache with aura   . OAB (overactive bladder)     Past Surgical History:  Procedure Laterality Date  . CESAREAN SECTION  2011  . LASIK Bilateral 2018  . NASAL SINUS SURGERY      Current Outpatient Medications  Medication Sig Dispense Refill  . AIMOVIG 140 MG/ML SOAJ   4  . amLODipine (NORVASC) 5 MG tablet Take by mouth.    Marland Kitchen buPROPion (WELLBUTRIN XL) 300 MG 24 hr tablet Take 1 tablet by mouth daily.    .  norethindrone (DEBLITANE) 0.35 MG tablet Take 1 tablet (0.35 mg total) by mouth daily. 84 tablet 3  . tiZANidine (ZANAFLEX) 4 MG tablet     . zonisamide (ZONEGRAN) 100 MG capsule Take 100 mg by mouth 2 (two) times daily.     No current facility-administered medications for this visit.     Family History  Problem Relation Age of Onset  . Cancer Mother        uterine cancer  . Diabetes Father        Type 2  . Cancer Father        metastatic lung  . Prostate cancer Father   . Cancer - Other Father        Squamous Cell of mouth x2  . Heart disease Father        open heart hurgery 2017  . Heart attack Father   . Osteoporosis Maternal Grandmother   . Lung cancer Maternal Grandfather   . Osteoporosis Paternal Grandmother   . Other Other        Dad with PDL1 Receptor, which inhibits T-cells (per pt.)    Review of Systems  Gastrointestinal:       Bloating  All other systems reviewed and are negative.   Exam:   BP 118/66 (BP Location: Right  Arm, Patient Position: Sitting, Cuff Size: Normal)   Pulse 90   Resp 18   Ht 5' 2.5" (1.588 m)   Wt 149 lb 6.4 oz (67.8 kg)   LMP 06/22/2017 (Approximate)   BMI 26.89 kg/m     General appearance: alert, cooperative and appears stated age Head: Normocephalic, without obvious abnormality, atraumatic Neck: no adenopathy, supple, symmetrical, trachea midline and thyroid normal to inspection and palpation Lungs: clear to auscultation bilaterally Breasts: normal appearance, no masses or tenderness, No nipple retraction or dimpling, No nipple discharge or bleeding, No axillary or supraclavicular adenopathy Heart: regular rate and rhythm Abdomen: soft, non-tender; no masses, no organomegaly Extremities: extremities normal, atraumatic, no cyanosis or edema Skin: Skin color, texture, turgor normal. No rashes or lesions Lymph nodes: Cervical, supraclavicular, and axillary nodes normal. No abnormal inguinal nodes palpated Neurologic: Grossly  normal  Pelvic: External genitalia:  no lesions              Urethra:  normal appearing urethra with no masses, tenderness or lesions              Bartholins and Skenes: normal                 Vagina: normal appearing vagina with normal color and discharge, no lesions              Cervix: no lesions              Pap taken: No. Bimanual Exam:  Uterus:  normal size, contour, position, consistency, mobility, non-tender              Adnexa: no mass, fullness, tenderness              Rectal exam: Yes.  .  Confirms.              Anus:  normal sphincter tone, no lesions  Chaperone was present for exam.  Assessment:   Well woman visit with normal exam. Last pap ASCUS and negative HR HPV 2017. FH uterine cancer.  Hx overactive bladder.  Bladder doing well.  Hx IBS.  Hx migraine with aura.  Amenorrhea.  Off POPs for 1 week.  Plan: Mammogram screening. Recommended self breast awareness. Pap and HR HPV as above. Guidelines for Calcium, Vitamin D, regular exercise program including cardiovascular and weight bearing exercise. FSH and estradiol. She may not need to continue with the Micronor. Follow up annually and prn.   After visit summary provided.

## 2018-10-18 LAB — ESTRADIOL

## 2018-10-18 LAB — FOLLICLE STIMULATING HORMONE: FSH: 68.7 m[IU]/mL

## 2018-10-23 ENCOUNTER — Encounter: Payer: Self-pay | Admitting: Obstetrics and Gynecology

## 2018-10-23 ENCOUNTER — Telehealth: Payer: Self-pay | Admitting: Obstetrics and Gynecology

## 2018-10-23 NOTE — Telephone Encounter (Signed)
Message   Good afternoon Dr. Edward JollySilva. Happy New Year. I was wondering if the menopause test results came back yet? My DOB is 04/14/1966 and last 4 of ssn is 8994. Thanks.

## 2018-10-23 NOTE — Telephone Encounter (Signed)
Encounter closed

## 2018-10-23 NOTE — Telephone Encounter (Signed)
   °  Message   Hi Dr. Edward Jolly. Please disregard my previous email. I see the test results. At least I know now why I am so grouchy!!! Have a great day.

## 2019-05-04 ENCOUNTER — Other Ambulatory Visit: Payer: Self-pay | Admitting: Obstetrics and Gynecology

## 2019-05-04 DIAGNOSIS — Z1231 Encounter for screening mammogram for malignant neoplasm of breast: Secondary | ICD-10-CM

## 2019-06-18 ENCOUNTER — Other Ambulatory Visit: Payer: Self-pay

## 2019-06-18 ENCOUNTER — Ambulatory Visit
Admission: RE | Admit: 2019-06-18 | Discharge: 2019-06-18 | Disposition: A | Discharge status: Home / Self Care | Payer: BC Managed Care – PPO | Source: Ambulatory Visit | Attending: Obstetrics and Gynecology | Admitting: Obstetrics and Gynecology

## 2019-06-18 DIAGNOSIS — Z1231 Encounter for screening mammogram for malignant neoplasm of breast: Secondary | ICD-10-CM

## 2019-09-03 ENCOUNTER — Encounter: Payer: Self-pay | Admitting: Obstetrics and Gynecology

## 2019-09-03 ENCOUNTER — Ambulatory Visit: Payer: BC Managed Care – PPO | Admitting: Obstetrics and Gynecology

## 2019-09-03 ENCOUNTER — Other Ambulatory Visit: Payer: Self-pay

## 2019-09-03 ENCOUNTER — Other Ambulatory Visit (HOSPITAL_COMMUNITY)
Admission: RE | Admit: 2019-09-03 | Discharge: 2019-09-03 | Disposition: A | Discharge status: Home / Self Care | Payer: BC Managed Care – PPO | Source: Ambulatory Visit | Attending: Obstetrics and Gynecology | Admitting: Obstetrics and Gynecology

## 2019-09-03 VITALS — BP 118/76 | HR 80 | Temp 97.6°F | Ht 62.5 in | Wt 142.0 lb

## 2019-09-03 DIAGNOSIS — N3090 Cystitis, unspecified without hematuria: Secondary | ICD-10-CM

## 2019-09-03 DIAGNOSIS — N952 Postmenopausal atrophic vaginitis: Secondary | ICD-10-CM | POA: Diagnosis not present

## 2019-09-03 DIAGNOSIS — R300 Dysuria: Secondary | ICD-10-CM

## 2019-09-03 DIAGNOSIS — R8290 Unspecified abnormal findings in urine: Secondary | ICD-10-CM | POA: Diagnosis not present

## 2019-09-03 LAB — POCT URINALYSIS DIPSTICK
Bilirubin, UA: NEGATIVE
Glucose, UA: NEGATIVE
Ketones, UA: NEGATIVE
Nitrite, UA: NEGATIVE
Protein, UA: NEGATIVE
Urobilinogen, UA: 0.2 E.U./dL
pH, UA: 5 (ref 5.0–8.0)

## 2019-09-03 MED ORDER — CIPROFLOXACIN HCL 500 MG PO TABS: 500.0000 mg | ORAL_TABLET | Freq: Two times a day (BID) | ORAL | 0 refills | Status: DC

## 2019-09-03 MED ORDER — PHENAZOPYRIDINE HCL 200 MG PO TABS: 200.0000 mg | ORAL_TABLET | Freq: Three times a day (TID) | ORAL | 0 refills | Status: DC | PRN

## 2019-09-03 NOTE — Patient Instructions (Addendum)
Urinary Tract Infection, Adult A urinary tract infection (UTI) is an infection of any part of the urinary tract. The urinary tract includes:  The kidneys.  The ureters.  The bladder.  The urethra. These organs make, store, and get rid of pee (urine) in the body. What are the causes? This is caused by germs (bacteria) in your genital area. These germs grow and cause swelling (inflammation) of your urinary tract. What increases the risk? You are more likely to develop this condition if:  You have a small, thin tube (catheter) to drain pee.  You cannot control when you pee or poop (incontinence).  You are female, and: ? You use these methods to prevent pregnancy: ? A medicine that kills sperm (spermicide). ? A device that blocks sperm (diaphragm). ? You have low levels of a female hormone (estrogen). ? You are pregnant.  You have genes that add to your risk.  You are sexually active.  You take antibiotic medicines.  You have trouble peeing because of: ? A prostate that is bigger than normal, if you are female. ? A blockage in the part of your body that drains pee from the bladder (urethra). ? A kidney stone. ? A nerve condition that affects your bladder (neurogenic bladder). ? Not getting enough to drink. ? Not peeing often enough.  You have other conditions, such as: ? Diabetes. ? A weak disease-fighting system (immune system). ? Sickle cell disease. ? Gout. ? Injury of the spine. What are the signs or symptoms? Symptoms of this condition include:  Needing to pee right away (urgently).  Peeing often.  Peeing small amounts often.  Pain or burning when peeing.  Blood in the pee.  Pee that smells bad or not like normal.  Trouble peeing.  Pee that is cloudy.  Fluid coming from the vagina, if you are female.  Pain in the belly or lower back. Other symptoms include:  Throwing up (vomiting).  No urge to eat.  Feeling mixed up (confused).  Being tired  and grouchy (irritable).  A fever.  Watery poop (diarrhea). How is this treated? This condition may be treated with:  Antibiotic medicine.  Other medicines.  Drinking enough water. Follow these instructions at home:  Medicines  Take over-the-counter and prescription medicines only as told by your doctor.  If you were prescribed an antibiotic medicine, take it as told by your doctor. Do not stop taking it even if you start to feel better. General instructions  Make sure you: ? Pee until your bladder is empty. ? Do not hold pee for a long time. ? Empty your bladder after sex. ? Wipe from front to back after pooping if you are a female. Use each tissue one time when you wipe.  Drink enough fluid to keep your pee pale yellow.  Keep all follow-up visits as told by your doctor. This is important. Contact a doctor if:  You do not get better after 1-2 days.  Your symptoms go away and then come back. Get help right away if:  You have very bad back pain.  You have very bad pain in your lower belly.  You have a fever.  You are sick to your stomach (nauseous).  You are throwing up. Summary  A urinary tract infection (UTI) is an infection of any part of the urinary tract.  This condition is caused by germs in your genital area.  There are many risk factors for a UTI. These include having a small, thin   tube to drain pee and not being able to control when you pee or poop.  Treatment includes antibiotic medicines for germs.  Drink enough fluid to keep your pee pale yellow. This information is not intended to replace advice given to you by your health care provider. Make sure you discuss any questions you have with your health care provider. Document Released: 03/26/2008 Document Revised: 09/25/2018 Document Reviewed: 04/17/2018 Elsevier Patient Education  2020 Elsevier Inc. Atrophic Vaginitis  Atrophic vaginitis is a condition in which the tissues that line the vagina  become dry and thin. This condition is most common in women who have stopped having regular menstrual periods (are in menopause). This usually starts when a woman is 45-55 years old. That is the time when a woman's estrogen levels begin to drop (decrease). Estrogen is a female hormone. It helps to keep the tissues of the vagina moist. It stimulates the vagina to produce a clear fluid that lubricates the vagina for sexual intercourse. This fluid also protects the vagina from infection. Lack of estrogen can cause the lining of the vagina to get thinner and dryer. The vagina may also shrink in size. It may become less elastic. Atrophic vaginitis tends to get worse over time as a woman's estrogen level drops. What are the causes? This condition is caused by the normal drop in estrogen that happens around the time of menopause. What increases the risk? Certain conditions or situations may lower a woman's estrogen level, leading to a higher risk for atrophic vaginitis. You are more likely to develop this condition if:  You are taking medicines that block estrogen.  You have had your ovaries removed.  You are being treated for cancer with X-ray (radiation) or medicines (chemotherapy).  You have given birth or are breastfeeding.  You are older than age 50.  You smoke. What are the signs or symptoms? Symptoms of this condition include:  Pain, soreness, or bleeding during sexual intercourse (dyspareunia).  Vaginal burning, irritation, or itching.  Pain or bleeding when a speculum is used in a vaginal exam (pelvic exam).  Having burning pain when passing urine.  Vaginal discharge that is brown or yellow. In some cases, there are no symptoms. How is this diagnosed? This condition is diagnosed by taking a medical history and doing a physical exam. This will include a pelvic exam that checks the vaginal tissues. Though rare, you may also have other tests, including:  A urine test.  A test that  checks the acid balance in your vagina (acid balance test). How is this treated? Treatment for this condition depends on how severe your symptoms are. Treatment may include:  Using an over-the-counter vaginal lubricant before sex.  Using a long-acting vaginal moisturizer.  Using low-dose vaginal estrogen for moderate to severe symptoms that do not respond to other treatments. Options include creams, tablets, and inserts (vaginal rings). Before you use a vaginal estrogen, tell your health care provider if you have a history of: ? Breast cancer. ? Endometrial cancer. ? Blood clots. If you are not sexually active and your symptoms are very mild, you may not need treatment. Follow these instructions at home: Medicines  Take over-the-counter and prescription medicines only as told by your health care provider. Do not use herbal or alternative medicines unless your health care provider says that you can.  Use over-the-counter creams, lubricants, or moisturizers for dryness only as directed by your health care provider. General instructions  If your atrophic vaginitis is caused by menopause,   discuss all of your menopause symptoms and treatment options with your health care provider.  Do not douche.  Do not use products that can make your vagina dry. These include: ? Scented feminine sprays. ? Scented tampons. ? Scented soaps.  Vaginal intercourse can help to improve blood flow and elasticity of vaginal tissue. If it hurts to have sex, try using a lubricant or moisturizer just before having intercourse. Contact a health care provider if:  Your discharge looks different than normal.  Your vagina has an unusual smell.  You have new symptoms.  Your symptoms do not improve with treatment.  Your symptoms get worse. Summary  Atrophic vaginitis is a condition in which the tissues that line the vagina become dry and thin. It is most common in women who have stopped having regular  menstrual periods (are in menopause).  Treatment options include using vaginal lubricants and low-dose vaginal estrogen.  Contact a health care provider if your vagina has an unusual smell, or if your symptoms get worse or do not improve after treatment. This information is not intended to replace advice given to you by your health care provider. Make sure you discuss any questions you have with your health care provider. Document Released: 02/22/2015 Document Revised: 09/20/2017 Document Reviewed: 07/04/2017 Elsevier Patient Education  2020 Elsevier Inc.  

## 2019-09-03 NOTE — Progress Notes (Signed)
GYNECOLOGY  VISIT   HPI: 53 y.o.   Divorced  Caucasian  female   G1P1001 with Patient's last menstrual period was 06/22/2017 (approximate).   here for possible UTI. Patient complaining of pain after urinating. Patient states has had 3 UTI's in the past 6-8 weeks.    She reports pelvic pressure, pain with urination at the of urination.  Denies blood in her urine.  Desire to void often.  No nausea and vomiting.  Does not feel great.  No fever or chills.  No back pain.   Not sexually active currently.  Last activity was one year ago.  Steady partner for 10 years.   States she has UTIs all her life and that it is difficult to get rid of it. She has not had UTIs for years prior to now.   Last UTI was around 08/10/19 - Rx for Bactrim DS for one week. Final UC showed <10,000 colonies.   Felt better after taking the medication. Prior UTI was 07/11/19 - Rx for Macrobid 100 mg po bid x 10 days.  Final UC showed E Coli > 100,000.  She has been seen by urologist in the past in [place].  No cystoscopy or renal US evaluation.   She does report having some problems with loose stools and bloating and constipation.   Just took Augmentin for a sinus infection.   Urine Dip: 2+WBCs, 1+ RBCs  GYNECOLOGIC HISTORY: Patient's last menstrual period was 06/22/2017 (approximate). Contraception: Postmenopausal Menopausal hormone therapy:  none Last mammogram: 06-18-19 3D/Neg/density C/BiRads1 Last pap smear:  09-18-16 ASCUS:Neg HR HPV, 07-10-13 Neg:Neg HR HPV        OB History    Gravida  1   Para  1   Term  1   Preterm  0   AB  0   Living  1     SAB  0   TAB  0   Ectopic  0   Multiple  0   Live Births  1        Obstetric Comments  Son is by sperm donor.           Patient Active Problem List   Diagnosis Date Noted  . Migraine with aura 07/10/2013  . Diverticulitis of colon (without mention of hemorrhage)(562.11) 07/10/2013  . Atypical migraine 06/22/2012    Past  Medical History:  Diagnosis Date  . Blood transfusion without reported diagnosis 04/2010  . Diverticulitis   . Environmental allergies   . Hypertension   . Migraine headache with aura   . OAB (overactive bladder)     Past Surgical History:  Procedure Laterality Date  . CESAREAN SECTION  2011  . LASIK Bilateral 2018  . NASAL SINUS SURGERY      Current Outpatient Medications  Medication Sig Dispense Refill  . AIMOVIG 140 MG/ML SOAJ   4  . amLODipine (NORVASC) 5 MG tablet Take by mouth.    Marland Kitchen buPROPion (WELLBUTRIN XL) 300 MG 24 hr tablet Take 1 tablet by mouth daily.    . Metoprolol-Hydrochlorothiazide 25-12.5 MG TB24 Take 1 tablet by mouth daily.    Marland Kitchen tiZANidine (ZANAFLEX) 4 MG tablet     . zonisamide (ZONEGRAN) 100 MG capsule Take 100 mg by mouth 2 (two) times daily.     No current facility-administered medications for this visit.      ALLERGIES: Patient has no known allergies.  Family History  Problem Relation Age of Onset  . Cancer Mother  uterine cancer  . Diabetes Father        Type 2  . Cancer Father        metastatic lung  . Prostate cancer Father   . Cancer - Other Father        Squamous Cell of mouth x2  . Heart disease Father        open heart hurgery 2017  . Heart attack Father   . Osteoporosis Maternal Grandmother   . Lung cancer Maternal Grandfather   . Osteoporosis Paternal Grandmother   . Other Other        Dad with PDL1 Receptor, which inhibits T-cells (per pt.)    Social History   Socioeconomic History  . Marital status: Divorced    Spouse name: Not on file  . Number of children: Not on file  . Years of education: Not on file  . Highest education level: Not on file  Occupational History  . Not on file  Social Needs  . Financial resource strain: Not on file  . Food insecurity    Worry: Not on file    Inability: Not on file  . Transportation needs    Medical: Not on file    Non-medical: Not on file  Tobacco Use  . Smoking status:  Never Smoker  . Smokeless tobacco: Never Used  Substance and Sexual Activity  . Alcohol use: Yes    Alcohol/week: 5.0 - 7.0 standard drinks    Types: 5 - 7 Glasses of wine per week  . Drug use: No  . Sexual activity: Not Currently    Partners: Male    Birth control/protection: Pill    Comment: Micronor--continuously  Lifestyle  . Physical activity    Days per week: Not on file    Minutes per session: Not on file  . Stress: Not on file  Relationships  . Social Herbalist on phone: Not on file    Gets together: Not on file    Attends religious service: Not on file    Active member of club or organization: Not on file    Attends meetings of clubs or organizations: Not on file    Relationship status: Not on file  . Intimate partner violence    Fear of current or ex partner: Not on file    Emotionally abused: Not on file    Physically abused: Not on file    Forced sexual activity: Not on file  Other Topics Concern  . Not on file  Social History Narrative  . Not on file    Review of Systems  Genitourinary:       Pain after urinating.  All other systems reviewed and are negative.   PHYSICAL EXAMINATION:    BP 118/76   Pulse 80   Temp 97.6 F (36.4 C) (Temporal)   Ht 5' 2.5" (1.588 m)   Wt 142 lb (64.4 kg)   LMP 06/22/2017 (Approximate)   BMI 25.56 kg/m     General appearance: alert, cooperative and appears stated age   Pelvic: External genitalia:  no lesions              Urethra:  normal appearing urethra with no masses, tenderness or lesions              Bartholins and Skenes: normal                 Vagina: normal appearing vagina with normal color and discharge, no lesions.  Atrophy noted.               Cervix: no lesions                Bimanual Exam:  Uterus:  normal size, contour, position, consistency, mobility, non-tender              Adnexa: no mass, fullness, tenderness           Chaperone was present for exam.  ASSESSMENT  Cystitis.   Dysuria.  Hx UTIs. Vaginal atrophy.  Hx complex migraine?  PLAN  Urine micro and cx. Rx for Ciprofloxacin and Pyridium.  I discussed risk of enterocolitis and tendon rupture with the use of Ciprofloxacin.  Vaginitis testing.  She will not take Tinazadine with ciprofloxacin.  We discussed UTI infection prevention with use of vaginal estrogen cream.  Potential effect on breast cancer discussed.  Dr. Janelle Floor at [facility] is her neurologist in [place].  We will check with her about use of local vaginal estrogen.    An After Visit Summary was printed and given to the patient.  _25_____ minutes face to face time of which over 50% was spent in counseling.

## 2019-09-04 LAB — URINALYSIS, MICROSCOPIC ONLY
Casts: NONE SEEN /lpf
WBC, UA: >30 /hpf — AB (ref 0–5)

## 2019-09-04 LAB — CERVICOVAGINAL ANCILLARY ONLY
Bacterial Vaginitis (gardnerella): POSITIVE — AB
Candida Glabrata: NEGATIVE
Candida Vaginitis: NEGATIVE
Comment: NEGATIVE
Comment: NEGATIVE
Comment: NEGATIVE
Comment: NEGATIVE
Trichomonas: NEGATIVE

## 2019-09-06 ENCOUNTER — Telehealth: Payer: Self-pay | Admitting: Obstetrics and Gynecology

## 2019-09-06 LAB — URINE CULTURE

## 2019-09-06 NOTE — Telephone Encounter (Signed)
Please contact Dr. Claudie Fisherman at [place], the patient's neurologist, about potential use of vaginal estrogen cream for my patient.   The patient has a history of complex migraine disorder on chart review, and I need to understand if her neurologist approves of the use of the vaginal estrogen for her.  I would like to do local vaginal estrogen treatment for vaginal atrophy and for prevention of recurrent UTIs.

## 2019-09-07 MED ORDER — METRONIDAZOLE 500 MG PO TABS: 500.0000 mg | ORAL_TABLET | Freq: Two times a day (BID) | ORAL | 0 refills | Status: DC

## 2019-09-07 NOTE — Telephone Encounter (Signed)
Jewel from Dr. Joanna Puff office stated that Dr. Sima Matas was OK with use of vaginal estrogen and OK to proceed with plan. Stated can be reached back with any questions at [phone]

## 2019-09-07 NOTE — Telephone Encounter (Signed)
Routing to Dr. Quincy Simmonds to review and advise on RX.

## 2019-09-07 NOTE — Telephone Encounter (Signed)
Left message for Dr. Joanna Puff CMA to call Sharee Pimple, RN at [facility] [phone] in regard to mutual patient. Advised per Dr. Quincy Simmonds as seen below, please return call to office to advise on vaginal estrogen usage.

## 2019-09-08 MED ORDER — ESTRADIOL 0.1 MG/GM VA CREA: TOPICAL_CREAM | VAGINAL | 1 refills | Status: DC

## 2019-09-08 NOTE — Telephone Encounter (Signed)
Call to patient, left detailed message, ok per dpr. Advised of Rx as seen below per Dr. Quincy Simmonds, has been sent to [facility]. Return call to office if any additional questions.   Encounter closed.

## 2019-09-08 NOTE — Telephone Encounter (Signed)
Ok for Estrace cream.  Place 1/2 gram per vagina at hs nightly for 2 weeks.  Then place 1/2 gram per vagina at hs two to three times weekly.  Use a pea size amount around the urethra each time.  Disp:  42.5 grams RF:  one

## 2019-09-19 ENCOUNTER — Other Ambulatory Visit: Payer: Self-pay | Admitting: Obstetrics and Gynecology

## 2019-09-21 NOTE — Telephone Encounter (Signed)
Patient returned call. Patient states that she was not able to completed the flagyl prescription as prescribed. States she was only able to take 9 out of the 14 tablets because they were "too chalky." Patient asking if she should try an alternative medication? Patient states she was not having symptoms at the time testing was done. RN advised would review with Dr. Quincy Simmonds and return call. Patient agreeable.

## 2019-09-21 NOTE — Telephone Encounter (Signed)
Message left to return call to Emily at [phone].    

## 2019-09-22 NOTE — Telephone Encounter (Signed)
Patient does not need to complete a treatment for bacterial vaginosis.

## 2019-09-22 NOTE — Telephone Encounter (Signed)
Spoke with patient, advised per Dr. Quincy Simmonds. Reviewed with patient causes and symptoms of BV, questions answered. No Rx sent. Patient verbalizes understanding and is agreeable.  Encounter closed.

## 2019-11-02 ENCOUNTER — Other Ambulatory Visit: Payer: Self-pay

## 2019-11-03 NOTE — Progress Notes (Signed)
54 y.o. G69P1001 Divorced Caucasian female here for annual exam.    Feels better after taking Ciprofloxacin for her UTI in November.  Not using her vaginal estrogen cream.  Does not need a refill.  PCP: Thalia Party, MD    Patient's last menstrual period was 06/22/2017 (approximate).           Sexually active: No.  The current method of family planning is post menopausal status.    Exercising: No.  The patient does not participate in regular exercise at present. Smoker:  no  Health Maintenance: Pap:  09-18-16 ASCUS:Neg HR HPV, 07-10-13 Neg:Neg HR HPV History of abnormal Pap:  no MMG: 06-18-19 3D/Neg/density C/BiRads1 Colonoscopy: 10-05-19 normal per patient;next 10 years BMD:   n/a  Result  n/a TDaP: 10-22-2009 Gardasil:   no HIV: 07/2019 NR per patient Hep C: 07/2019 Neg per patient Screening Labs:  PCP in October, 2020.  Flu vaccine:  Completed.  Shingrix:  Completed.    reports that she has never smoked. She has never used smokeless tobacco. She reports current alcohol use of about 5.0 - 7.0 standard drinks of alcohol per week. She reports that she does not use drugs.  Past Medical History:  Diagnosis Date  . Blood transfusion without reported diagnosis 04/2010  . Diverticulitis   . Environmental allergies   . Hypertension   . Migraine headache with aura   . OAB (overactive bladder)   . Tachycardia     Past Surgical History:  Procedure Laterality Date  . CESAREAN SECTION  2011  . LASIK Bilateral 2018  . NASAL SINUS SURGERY      Current Outpatient Medications  Medication Sig Dispense Refill  . AIMOVIG 140 MG/ML SOAJ   4  . amLODipine (NORVASC) 5 MG tablet Take by mouth.    Marland Kitchen buPROPion (WELLBUTRIN XL) 300 MG 24 hr tablet Take 1 tablet by mouth daily.    Marland Kitchen estradiol (ESTRACE VAGINAL) 0.1 MG/GM vaginal cream Place 1/2 gram vaginally at hs nightly for 2 wks. Then place 1/2 gram vaginally at hs 2 -3 times weekly. Use a pea sized amount around the urethra each time. 42.5  g 1  . Metoprolol-Hydrochlorothiazide 25-12.5 MG TB24 Take 1 tablet by mouth daily.    Marland Kitchen tiZANidine (ZANAFLEX) 4 MG tablet     . zonisamide (ZONEGRAN) 100 MG capsule Take 100 mg by mouth 2 (two) times daily.     No current facility-administered medications for this visit.    Family History  Problem Relation Age of Onset  . Cancer Mother        uterine cancer  . Diabetes Father        Type 2  . Cancer Father        metastatic lung  . Prostate cancer Father   . Cancer - Other Father        Squamous Cell of mouth x2  . Heart disease Father        open heart hurgery 2017  . Heart attack Father   . Myelodysplastic syndrome Father   . Osteoporosis Maternal Grandmother   . Lung cancer Maternal Grandfather   . Osteoporosis Paternal Grandmother   . Other Other        Dad with PDL1 Receptor, which inhibits T-cells (per pt.)    Review of Systems  All other systems reviewed and are negative.   Exam:   BP 112/78   Pulse 70   Temp (!) 97.3 F (36.3 C) (Temporal)   Resp  14   Ht 5' 2.5" (1.588 m)   Wt 139 lb 9.6 oz (63.3 kg)   LMP 06/22/2017 (Approximate)   BMI 25.13 kg/m     General appearance: alert, cooperative and appears stated age Head: normocephalic, without obvious abnormality, atraumatic Neck: no adenopathy, supple, symmetrical, trachea midline and thyroid normal to inspection and palpation Lungs: clear to auscultation bilaterally Breasts: normal appearance, no masses or tenderness, No nipple retraction or dimpling, No nipple discharge or bleeding, No axillary adenopathy Heart: regular rate and rhythm Abdomen: soft, non-tender; no masses, no organomegaly Extremities: extremities normal, atraumatic, no cyanosis or edema Skin: skin color, texture, turgor normal. No rashes or lesions Lymph nodes: cervical, supraclavicular, and axillary nodes normal. Neurologic: grossly normal  Pelvic: External genitalia:  no lesions              No abnormal inguinal nodes palpated.               Urethra:  normal appearing urethra with no masses, tenderness or lesions              Bartholins and Skenes: normal                 Vagina: normal appearing vagina with normal color and discharge, no lesions              Cervix: no lesions              Pap taken: Yes.   Bimanual Exam:  Uterus:  normal size, contour, position, consistency, mobility, non-tender              Adnexa: no mass, fullness, tenderness              Rectal exam: Yes.  .  Confirms.              Anus:  normal sphincter tone, no lesions  Chaperone was present for exam.  Assessment:   Well woman visit with normal exam. Last pap ASCUS and negative HR HPV 2017. FH uterine cancer.  Hx overactive bladder.    Hx IBS.  Hx migraine with aura.  Plan: Mammogram screening discussed. Self breast awareness reviewed. Pap and HR HPV as above. Guidelines for Calcium, Vitamin D, regular exercise program including cardiovascular and weight bearing exercise. Tdap today.  No refills of vaginal estrogen needed.  We discussed the risks and benefits of this.   She knows to call if she develops any vaginal bleeding.  Follow up annually and prn.   After visit summary provided.

## 2019-11-04 ENCOUNTER — Other Ambulatory Visit (HOSPITAL_COMMUNITY)
Admission: RE | Admit: 2019-11-04 | Discharge: 2019-11-04 | Disposition: A | Discharge status: Home / Self Care | Payer: BC Managed Care – PPO | Source: Ambulatory Visit | Attending: Obstetrics and Gynecology | Admitting: Obstetrics and Gynecology

## 2019-11-04 ENCOUNTER — Encounter: Payer: Self-pay | Admitting: Obstetrics and Gynecology

## 2019-11-04 ENCOUNTER — Ambulatory Visit: Payer: BLUE CROSS/BLUE SHIELD | Admitting: Obstetrics and Gynecology

## 2019-11-04 ENCOUNTER — Other Ambulatory Visit: Payer: Self-pay

## 2019-11-04 VITALS — BP 112/78 | HR 70 | Temp 97.3°F | Resp 14 | Ht 62.5 in | Wt 139.6 lb

## 2019-11-04 DIAGNOSIS — Z23 Encounter for immunization: Secondary | ICD-10-CM | POA: Diagnosis not present

## 2019-11-04 DIAGNOSIS — Z01419 Encounter for gynecological examination (general) (routine) without abnormal findings: Secondary | ICD-10-CM

## 2019-11-04 NOTE — Patient Instructions (Signed)

## 2019-11-05 LAB — CYTOLOGY - PAP
Comment: NEGATIVE
Diagnosis: NEGATIVE
High risk HPV: NEGATIVE

## 2020-06-02 ENCOUNTER — Ambulatory Visit: Payer: BC Managed Care – PPO | Admitting: Allergy

## 2020-06-02 ENCOUNTER — Encounter: Payer: Self-pay | Admitting: Allergy

## 2020-06-02 ENCOUNTER — Other Ambulatory Visit: Payer: Self-pay

## 2020-06-02 VITALS — BP 124/88 | HR 78 | Temp 98.6°F | Resp 18 | Ht 63.0 in | Wt 147.4 lb

## 2020-06-02 DIAGNOSIS — L308 Other specified dermatitis: Secondary | ICD-10-CM | POA: Diagnosis not present

## 2020-06-02 MED ORDER — HYDROXYZINE HCL 25 MG PO TABS: ORAL_TABLET | ORAL | 5 refills | Status: DC

## 2020-06-02 MED ORDER — MOMETASONE FUROATE 0.1 % EX CREA: 1.0000 "application " | TOPICAL_CREAM | Freq: Every day | CUTANEOUS | 5 refills | Status: DC

## 2020-06-02 MED ORDER — EUCRISA 2 % EX OINT: 1.0000 "application " | TOPICAL_OINTMENT | Freq: Two times a day (BID) | CUTANEOUS | 5 refills | Status: DC | PRN

## 2020-06-02 NOTE — Patient Instructions (Addendum)
°-   hydroxyzine 25mg  as needed at bedtime for itch control - recommend taking a long-acting antihistamine during the day like Allegra 180mg , Xyzal 5mg  or Zyrtec 10mg  - use Elocon cream 1 application to rash (itchy, red, inflamed, irritated, dry, scaly, flaky) areas as needed.  This is a topical steroid that may work better than triamcinolone - use Eucrisa ointment 1 application twice a day to rash areas as needed. This is a non-steroidal ointment that can be used anywhere on body - environmental allergy testing is positive to tree pollen. - food allergy testing is negative - recommend patch testing to determine if your rash is related to contact exposures.  Patch testing to the TRUE test patch panels is best placed on a Monday with return to office on Wednesday and Friday of same week.  Patch testing should be performed after 4 weeks off any prednisone or systemic steroid (like steroid injections).  Once patching are in place they should not get wet.  Can continue antihistamines for patch testing.    Follow-up for patch testing

## 2020-06-02 NOTE — Progress Notes (Signed)
New Patient Note  RE: Nancy Brewer MRN: [id number] DOB: 09/12/1966 Date of Office Visit: 06/02/2020  Referring provider: No ref. provider found Primary care provider: Thalia Party  Chief Complaint: itchy rash  History of present illness: Nancy Brewer is a 54 y.o. female presenting today for evaluation of rash.    Rash started in June 2021.  She has been to 2 dermatologists [facility] dermatology and [facility] dermatology) and 2 UC visits for this rash.  She states initially the dermatologist thought it was scabies however another provider did not.   She has had both steroid injection and a prednisone course of 10-14 days that she finished about 1.5 weeks ago.  After she completed the prednisone course the rash returned.   She has a vegetable garden and was picking her vegetables and the rash seemed to flare.  Thus she is wondering if she is allergic to something in her garden.   Rash is very itchy and is occurring daily (besides the days it resolved with systemic steroids).  The rash is on lower back, shoulders/neck, arms, abdomen, legs.  She describes the rash as "little pimples" under the skin.  When she scratches is when the rash turns red.  Not blistering or oozy.    She is taking hydroxyzine at bedtime for itch control. She has been using triamcinolone but has not helped at all.  She has also tried calamine lotion as well.    No fevers. No prior travel. No household members with similar symptoms.  She doesn't use any fertilizer or pesticide for gardening.  No change in detergents, body products, soaps.  Doesn't feel like food is worsening symptoms.    History of allergic rhinitis as a child but no issues at this time. No history of eczema.  She is a former pt of Dr. Neldon Mc many years ago for ?asthma.  She states she was on several different inhalers at that time.  She states she hasn't had any symptoms or need for inhaler use in years.    Review of systems: Review of  Systems  Constitutional: Negative.   HENT: Negative.   Eyes: Negative.   Respiratory: Negative.   Cardiovascular: Negative.   Gastrointestinal: Negative.   Musculoskeletal: Negative.   Skin:       See HPI  Neurological: Negative.     All other systems negative unless noted above in HPI  Past medical history: Past Medical History:  Diagnosis Date  . Blood transfusion without reported diagnosis 04/2010  . Diverticulitis   . Environmental allergies   . Hypertension   . Migraine headache with aura   . OAB (overactive bladder)   . Tachycardia     Past surgical history: Past Surgical History:  Procedure Laterality Date  . CESAREAN SECTION  2011  . LASIK Bilateral 2018  . NASAL SINUS SURGERY      Family history:  Family History  Problem Relation Age of Onset  . Cancer Mother        uterine cancer  . Allergic rhinitis Mother   . Asthma Mother   . Eczema Mother   . Diabetes Father        Type 2  . Cancer Father        metastatic lung  . Prostate cancer Father   . Cancer - Other Father        Squamous Cell of mouth x2  . Heart disease Father        open heart hurgery  2017  . Heart attack Father   . Myelodysplastic syndrome Father   . Allergic rhinitis Father   . Osteoporosis Maternal Grandmother   . Lung cancer Maternal Grandfather   . Osteoporosis Paternal Grandmother   . Other Other        Dad with PDL1 Receptor, which inhibits T-cells (per pt.)    Social history: Lives in a home with carpeting in the bedroom with gas heating and central cooling.  3 dogs in the home.  There is no concern for water damage, mildew or roaches in the home.  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  She denies a smoking history.  Medication List: Current Outpatient Medications  Medication Sig Dispense Refill  . AIMOVIG 140 MG/ML SOAJ   4  . amLODipine (NORVASC) 5 MG tablet Take by mouth.    Marland Kitchen buPROPion (WELLBUTRIN XL) 300 MG 24 hr tablet Take 1 tablet by mouth daily.    .  hydrOXYzine (ATARAX/VISTARIL) 25 MG tablet Take 25 mg by mouth every 6 (six) hours as needed.    . Metoprolol-Hydrochlorothiazide 25-12.5 MG TB24 Take 1 tablet by mouth daily.    Marland Kitchen tiZANidine (ZANAFLEX) 4 MG tablet     . zonisamide (ZONEGRAN) 100 MG capsule Take 100 mg by mouth 2 (two) times daily.     No current facility-administered medications for this visit.    Known medication allergies: No Known Allergies   Physical examination: Blood pressure 124/88, pulse 78, temperature 98.6 F (37 C), temperature source Temporal, resp. rate 18, height 5' 3"  (1.6 m), weight 147 lb 6.4 oz (66.9 kg), last menstrual period 06/22/2017, SpO2 97 %.  General: Alert, interactive, in no acute distress. HEENT: PERRLA, TMs pearly gray, turbinates non-edematous without discharge, post-pharynx non erythematous. Neck: Supple without lymphadenopathy. Lungs: Clear to auscultation without wheezing, rhonchi or rales. {no increased work of breathing. CV: Normal S1, S2 without murmurs. Abdomen: Nondistended, nontender. Skin: Mildly erythematous rough patch on the right forearm, left wrist, left shin. Extremities:  No clubbing, cyanosis or edema. Neuro:   Grossly intact.  Diagnositics/Labs:  Allergy testing: Environmental allergy skin prick testing is positive to [place] and [place]. Common 10 food allergen skin prick testing is negative. Allergy testing results were read and interpreted by provider, documented by clinical staff.   Assessment and plan: Dermatitis, eczematous  - hydroxyzine 6m as needed at bedtime for itch control - recommend taking a long-acting antihistamine during the day like Allegra 1864m Xyzal 77m36mr Zyrtec 65m50muse Elocon cream 1 application to rash (itchy, red, inflamed, irritated, dry, scaly, flaky) areas as needed.  This is a topical steroid that may work better than triamcinolone - use Eucrisa ointment 1 application twice a day to rash areas as needed. This is a  non-steroidal ointment that can be used anywhere on body - environmental allergy testing is positive to tree pollen. - food allergy testing is negative - recommend patch testing to determine if your rash is related to contact exposures.  Patch testing to the TRUE test patch panels is best placed on a Monday with return to office on Wednesday and Friday of same week.  Patch testing should be performed after 4 weeks off any prednisone or systemic steroid (like steroid injections).  Once patching are in place they should not get wet.  Can continue antihistamines for patch testing.    Follow-up for patch testing (True Test)  I appreciate the opportunity to take part in [place]. Please do not hesitate to contact  me with questions.  Sincerely,   Prudy Feeler, MD Allergy/Immunology [facility]

## 2020-06-03 ENCOUNTER — Other Ambulatory Visit: Payer: Self-pay | Admitting: Obstetrics and Gynecology

## 2020-06-03 DIAGNOSIS — Z1231 Encounter for screening mammogram for malignant neoplasm of breast: Secondary | ICD-10-CM

## 2020-06-20 ENCOUNTER — Ambulatory Visit: Payer: BC Managed Care – PPO | Admitting: Family

## 2020-06-20 ENCOUNTER — Encounter: Payer: Self-pay | Admitting: Family

## 2020-06-20 ENCOUNTER — Other Ambulatory Visit: Payer: Self-pay

## 2020-06-20 VITALS — BP 108/68 | HR 82 | Resp 16

## 2020-06-20 DIAGNOSIS — L259 Unspecified contact dermatitis, unspecified cause: Secondary | ICD-10-CM

## 2020-06-20 NOTE — Progress Notes (Signed)
Follow-up Note  RE: Nancy Brewer MRN: [id number] DOB: [birth year] Date of Office Visit: 06/20/2020  Primary care provider: Johna Roles Referring provider: No ref. provider found   Marlie returns to the office today for the patch test placement, given suspected history of contact dermatitis.    Diagnostics: True Test patches placed.    Plan:   Allergic contact dermatitis - Instructions provided on care of the patches for the next 48 hours. [name] was instructed to avoid showering for the next 48 hours. Kameran Lallier will follow up in 48 hours and 96 hours for patch readings.  Thank you for the opportunity to care for this patient.  Nehemiah Settle, FNP [facility]

## 2020-06-22 ENCOUNTER — Other Ambulatory Visit: Payer: Self-pay

## 2020-06-22 ENCOUNTER — Ambulatory Visit: Payer: BC Managed Care – PPO | Admitting: Allergy

## 2020-06-22 ENCOUNTER — Encounter: Payer: Self-pay | Admitting: Allergy

## 2020-06-22 VITALS — Temp 98.2°F

## 2020-06-22 DIAGNOSIS — L2489 Irritant contact dermatitis due to other agents: Secondary | ICD-10-CM

## 2020-06-22 NOTE — Progress Notes (Signed)
    Follow-up Note  RE: Nancy Brewer MRN: [id number] DOB: 29-May-1966 Date of Office Visit: 06/22/2020  Primary care provider: Johna Roles Referring provider: No ref. provider found   Nancy Brewer returns to the office today for the initial patch test interpretation, given suspected history of contact dermatitis.    Diagnostics:  TRUE TEST 48 hour reading: irritant reaction (mild erythema) of #28 gold sodium thiosulfate, #30 budesonide, #31 hydrocortisone-17-butyrate  Plan:  Allergic contact dermatitis - The patient has been provided detailed information regarding the substances she is sensitive to, as well as products containing the substances.  Meticulous avoidance of these substances is recommended.  - she will return in 2 days for final (96hr) reading  Margo Aye, MD [facility]

## 2020-06-24 ENCOUNTER — Ambulatory Visit (INDEPENDENT_AMBULATORY_CARE_PROVIDER_SITE_OTHER): Payer: BC Managed Care – PPO | Admitting: Allergy

## 2020-06-24 ENCOUNTER — Other Ambulatory Visit: Payer: Self-pay

## 2020-06-24 ENCOUNTER — Encounter: Payer: Self-pay | Admitting: Allergy

## 2020-06-24 DIAGNOSIS — L2489 Irritant contact dermatitis due to other agents: Secondary | ICD-10-CM | POA: Diagnosis not present

## 2020-06-24 NOTE — Progress Notes (Signed)
    Follow-up Note  RE: ETTER ROYALL MRN: [id number] DOB: 1965/10/31 Date of Office Visit: 06/24/2020  Primary care provider: Johna Roles Referring provider: No ref. provider found   Heran returns to the office today for the final patch test interpretation, given suspected history of contact dermatitis.    Diagnostics:  TRUE TEST 96 hour reading: Possible papule, 1+ hydrocortisone-17-butyrate   TRUE TEST 48 hour reading:  irritant reaction (mild erythema) of #28 gold sodium thiosulfate, #30 budesonide, #31 hydrocortisone-17-butyrate  Plan:  Allergic contact dermatitis  The patient has been provided detailed information regarding the substances she is sensitive to, as well as products containing the substances.  Meticulous avoidance of these substances is recommended. If avoidance is not possible, the use of barrier creams or lotions is recommended.  Margo Aye, MD [facility]

## 2020-06-28 ENCOUNTER — Ambulatory Visit
Admission: RE | Admit: 2020-06-28 | Discharge: 2020-06-28 | Disposition: A | Discharge status: Home / Self Care | Payer: BC Managed Care – PPO | Source: Ambulatory Visit | Attending: Obstetrics and Gynecology | Admitting: Obstetrics and Gynecology

## 2020-06-28 ENCOUNTER — Other Ambulatory Visit: Payer: Self-pay

## 2020-06-28 DIAGNOSIS — Z1231 Encounter for screening mammogram for malignant neoplasm of breast: Secondary | ICD-10-CM

## 2020-11-30 ENCOUNTER — Ambulatory Visit: Payer: BLUE CROSS/BLUE SHIELD | Admitting: Obstetrics and Gynecology

## 2021-01-25 ENCOUNTER — Ambulatory Visit (INDEPENDENT_AMBULATORY_CARE_PROVIDER_SITE_OTHER): Payer: BC Managed Care – PPO | Admitting: Obstetrics and Gynecology

## 2021-01-25 ENCOUNTER — Other Ambulatory Visit: Payer: Self-pay

## 2021-01-25 ENCOUNTER — Encounter: Payer: Self-pay | Admitting: Obstetrics and Gynecology

## 2021-01-25 VITALS — BP 120/82 | HR 84 | Ht 62.5 in | Wt 147.0 lb

## 2021-01-25 DIAGNOSIS — Z01419 Encounter for gynecological examination (general) (routine) without abnormal findings: Secondary | ICD-10-CM

## 2021-01-25 NOTE — Progress Notes (Signed)
55 y.o. G98P1001 Divorced Caucasian female here for annual exam.    Not having menopausal hot flashes.  Sleeping ok but not necessarily all night.   UTIs have subsided.  Urinary frequency has improved. Not using vaginal estrogen cream.  No new partners.   Having pruritis.  Seeing dermatologists, allergist, and urgent care providers.  Will see her PCP this week.  Did blood work with her PCP this year and had elevated cholesterol, elevated HDL, and abnormal creatinine level.  Covid 2nd booster will be done this week.   PCP: Thalia Party, MD   Patient's last menstrual period was 06/22/2017 (approximate).           Sexually active: No.  The current method of family planning is post menopausal status.    Exercising: Yes.    treadmill and weights 2x/week Smoker:  no  Health Maintenance: Pap: 11-04-19 Neg:Neg HR HPV, 09-18-16 ASCUS:Neg HR HPV, 07-10-13 Neg:Neg HR HPV History of abnormal Pap:  no MMG:  06-28-20 3D/Neg/BiRads1 Colonoscopy:  10-05-19 normal per patient;next 10 years BMD:   n/a  Result  n/a TDaP:  11-04-19 Gardasil:   no HIV:08-25-19 NR Hep C:08-25-19 Neg Screening Labs:  PCP.    reports that she has never smoked. She has never used smokeless tobacco. She reports current alcohol use of about 5.0 - 7.0 standard drinks of alcohol per week. She reports that she does not use drugs.  Past Medical History:  Diagnosis Date  . Blood transfusion without reported diagnosis 04/2010  . Diverticulitis   . Environmental allergies   . Hypertension   . Migraine headache with aura   . OAB (overactive bladder)   . Tachycardia     Past Surgical History:  Procedure Laterality Date  . CESAREAN SECTION  2011  . LASIK Bilateral 2018  . NASAL SINUS SURGERY      Current Outpatient Medications  Medication Sig Dispense Refill  . amLODipine (NORVASC) 5 MG tablet Take by mouth.    . baclofen (LIORESAL) 10 MG tablet Take by mouth.    Marland Kitchen buPROPion (WELLBUTRIN XL) 300 MG 24 hr tablet  Take 1 tablet by mouth daily.    Stasia Cavalier (EUCRISA) 2 % OINT Apply 1 application topically 2 (two) times daily as needed. 100 g 5  . hydrOXYzine (ATARAX/VISTARIL) 25 MG tablet 1 tablet as needed at bedtime for itch control. 30 tablet 5  . metoprolol succinate (TOPROL-XL) 25 MG 24 hr tablet Take 1 tablet by mouth daily.    . mometasone (ELOCON) 0.1 % cream Apply 1 application topically daily. 50 g 5  . NURTEC 75 MG TBDP Take by mouth.    . UBRELVY 100 MG TABS Take by mouth.    . zonisamide (ZONEGRAN) 100 MG capsule Take 100 mg by mouth 2 (two) times daily.     No current facility-administered medications for this visit.    Family History  Problem Relation Age of Onset  . Cancer Mother        uterine cancer  . Allergic rhinitis Mother   . Asthma Mother   . Eczema Mother   . Diabetes Father        Type 2  . Cancer Father        metastatic lung  . Prostate cancer Father   . Cancer - Other Father        Squamous Cell of mouth x2  . Heart disease Father        open heart hurgery 2017  . Heart  attack Father   . Myelodysplastic syndrome Father   . Allergic rhinitis Father   . Osteoporosis Maternal Grandmother   . Lung cancer Maternal Grandfather   . Osteoporosis Paternal Grandmother   . Other Other        Dad with PDL1 Receptor, which inhibits T-cells (per pt.)    Review of Systems  All other systems reviewed and are negative.   Exam:   BP 120/82   Pulse 84   Ht 5' 2.5" (1.588 m)   Wt 147 lb (66.7 kg)   LMP 06/22/2017 (Approximate)   SpO2 99%   BMI 26.46 kg/m     General appearance: alert, cooperative and appears stated age Head: normocephalic, without obvious abnormality, atraumatic Neck: no adenopathy, supple, symmetrical, trachea midline and thyroid normal to inspection and palpation Lungs: clear to auscultation bilaterally Breasts: normal appearance, no masses or tenderness, No nipple retraction or dimpling, No nipple discharge or bleeding, No axillary  adenopathy Heart: regular rate and rhythm Abdomen: soft, non-tender; no masses, no organomegaly Extremities: extremities normal, atraumatic, no cyanosis or edema Skin: skin color, texture, turgor normal. No rashes or lesions Lymph nodes: cervical, supraclavicular, and axillary nodes normal. Neurologic: grossly normal  Pelvic: External genitalia:  no lesions              No abnormal inguinal nodes palpated.              Urethra:  normal appearing urethra with no masses, tenderness or lesions              Bartholins and Skenes: normal                 Vagina: normal appearing vagina with normal color and discharge, no lesions              Cervix: no lesions              Pap taken: No. Bimanual Exam:  Uterus:  normal size, contour, position, consistency, mobility, non-tender              Adnexa: no mass, fullness, tenderness              Rectal exam: Yes.  .  Confirms.              Anus:  normal sphincter tone, no lesions  Chaperone was present for exam.  Assessment:   Well woman visit with normal exam. FH uterine cancer.  Hx overactive bladder.  Hx IBS.  Hx migraine with aura. Pruritis.   Plan: Mammogram screening discussed. Self breast awareness reviewed. Pap and HR HPV as above. Guidelines for Calcium, Vitamin D, regular exercise program including cardiovascular and weight bearing exercise.   Follow up annually and prn.

## 2021-01-25 NOTE — Patient Instructions (Signed)

## 2021-07-20 ENCOUNTER — Other Ambulatory Visit: Payer: Self-pay | Admitting: Obstetrics and Gynecology

## 2021-07-20 DIAGNOSIS — Z1231 Encounter for screening mammogram for malignant neoplasm of breast: Secondary | ICD-10-CM

## 2021-08-17 ENCOUNTER — Ambulatory Visit
Admission: RE | Admit: 2021-08-17 | Discharge: 2021-08-17 | Disposition: A | Discharge status: Home / Self Care | Payer: BC Managed Care – PPO | Source: Ambulatory Visit | Attending: Obstetrics and Gynecology | Admitting: Obstetrics and Gynecology

## 2021-08-17 ENCOUNTER — Other Ambulatory Visit: Payer: Self-pay

## 2021-08-17 DIAGNOSIS — Z1231 Encounter for screening mammogram for malignant neoplasm of breast: Secondary | ICD-10-CM

## 2022-03-30 IMAGING — MG MM DIGITAL SCREENING BILAT W/ TOMO AND CAD
8 series · 9 of 24 positions shown · non-contrast
Comparison: Previous exam(s).

CLINICAL DATA: Screening.

EXAM:
DIGITAL SCREENING BILATERAL MAMMOGRAM WITH TOMOSYNTHESIS AND CAD
TECHNIQUE: Bilateral screening digital craniocaudal and mediolateral oblique
mammograms were obtained. Bilateral screening digital breast
tomosynthesis was performed. The images were evaluated with
computer-aided detection.

[R CC synth-2D]
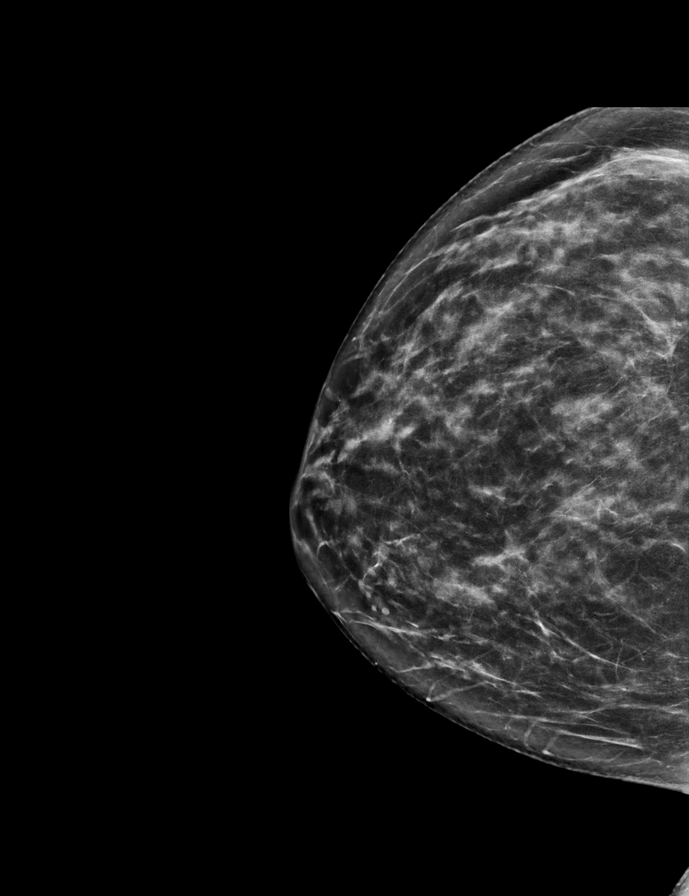

[L MLO synth-2D]
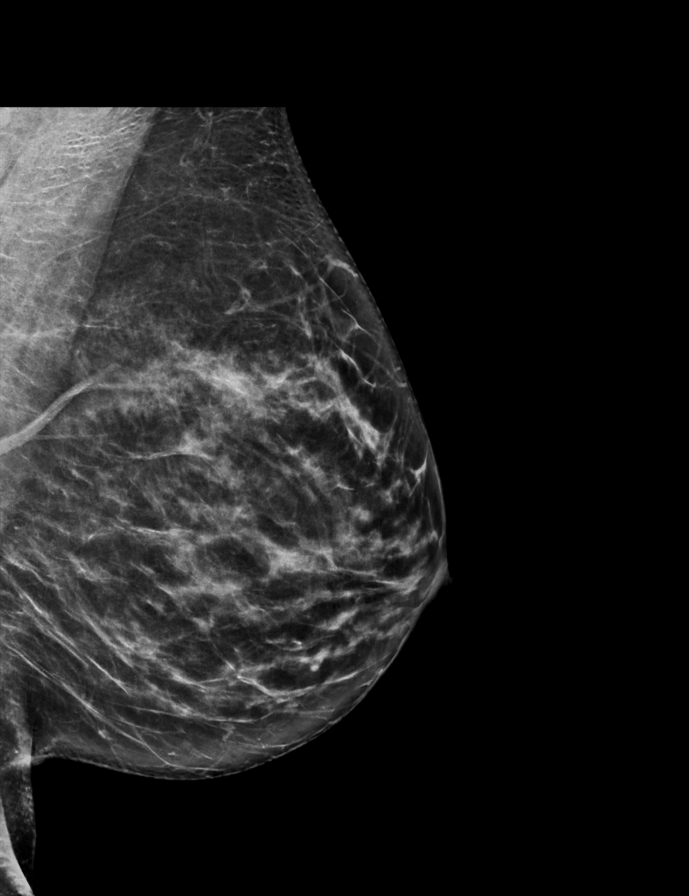

[L CC synth-2D]
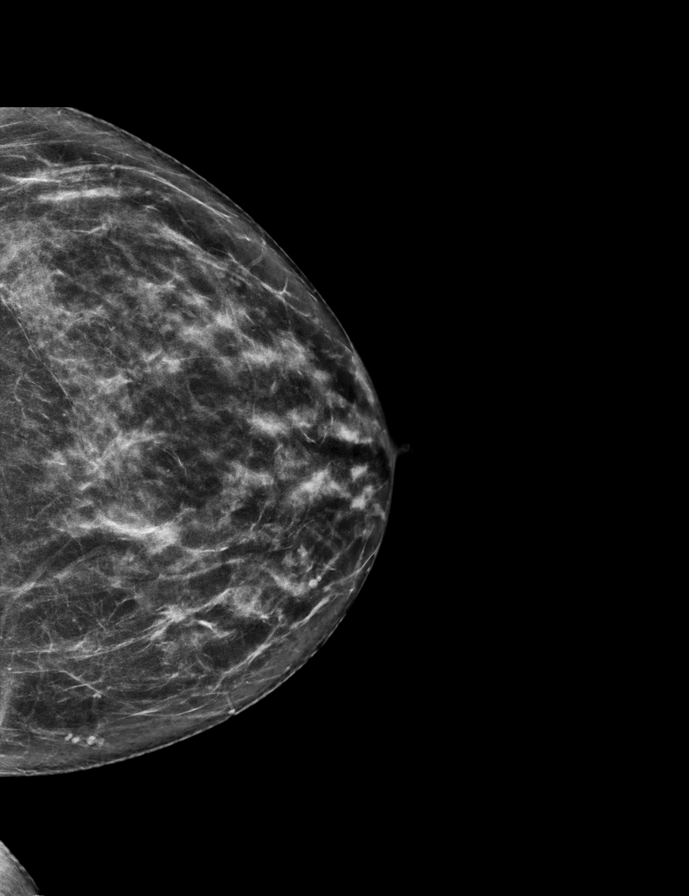

[R MLO synth-2D]
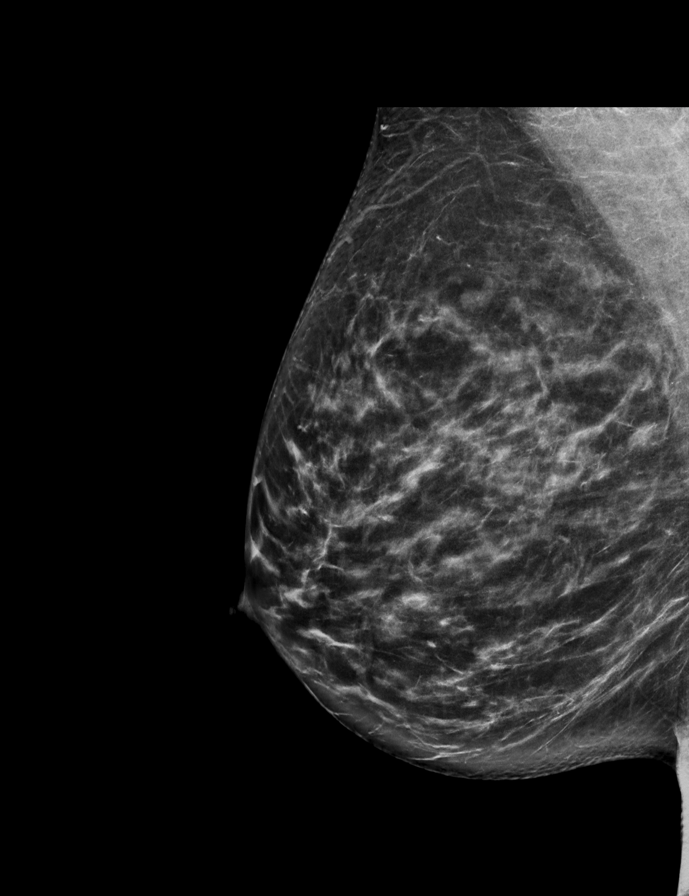

[R MLO tomo · 2 of 71 frames shown]
[frame 23/71]
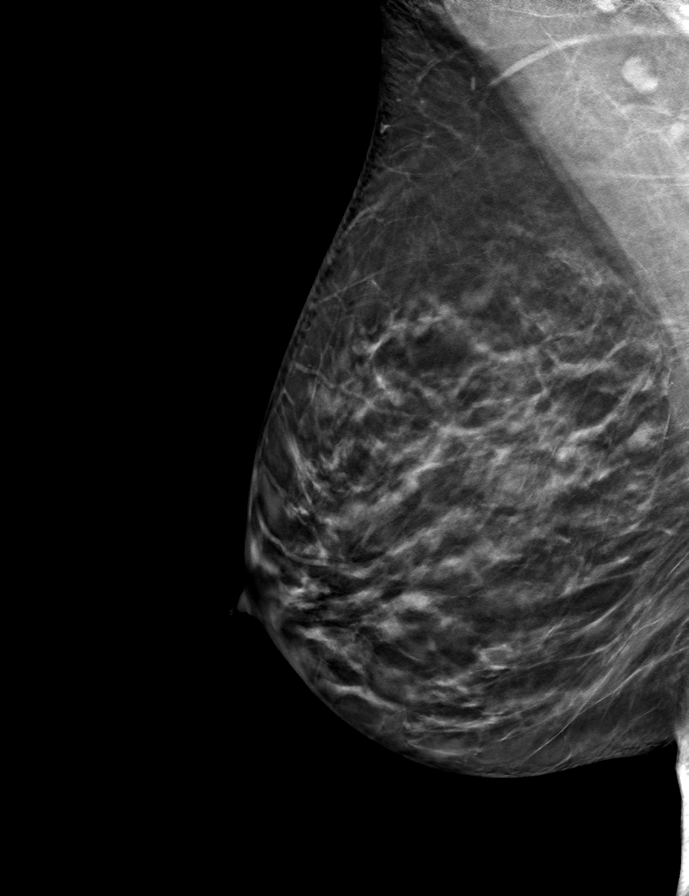
[frame 36/71]
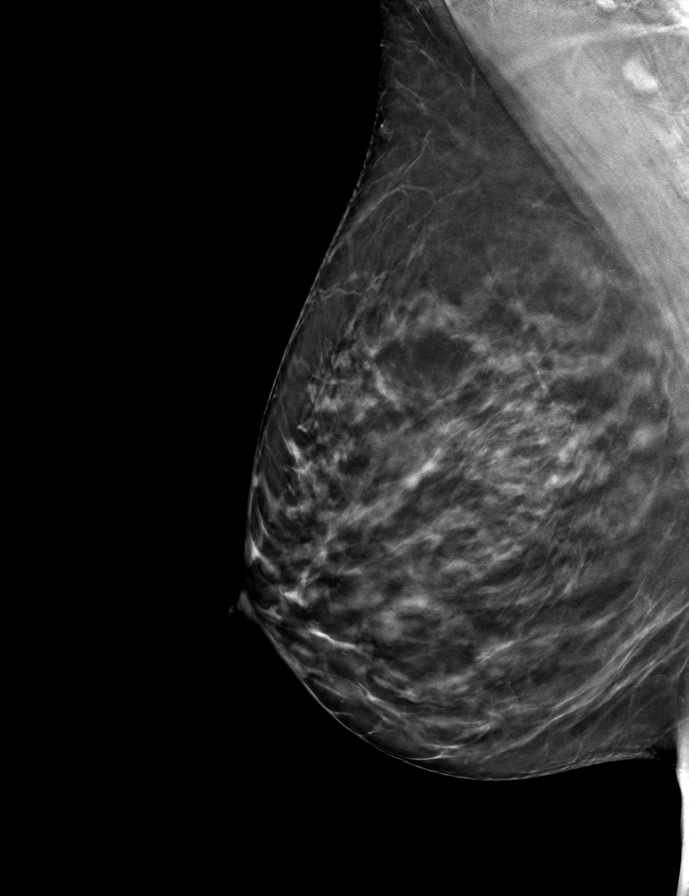

[L MLO tomo · tomo slice 35/69.0]
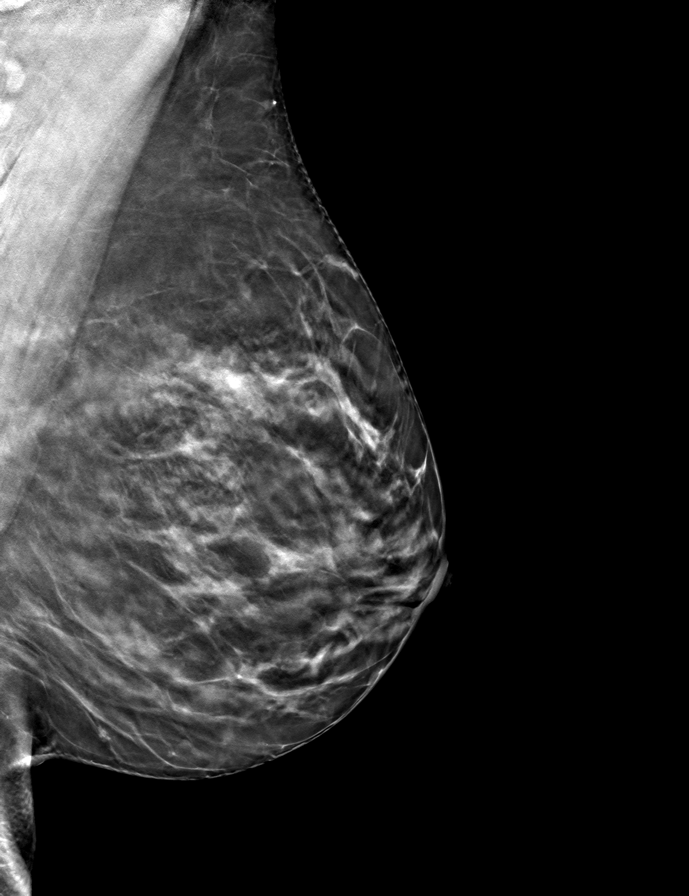

[L CC tomo · tomo slice 31/62.0]
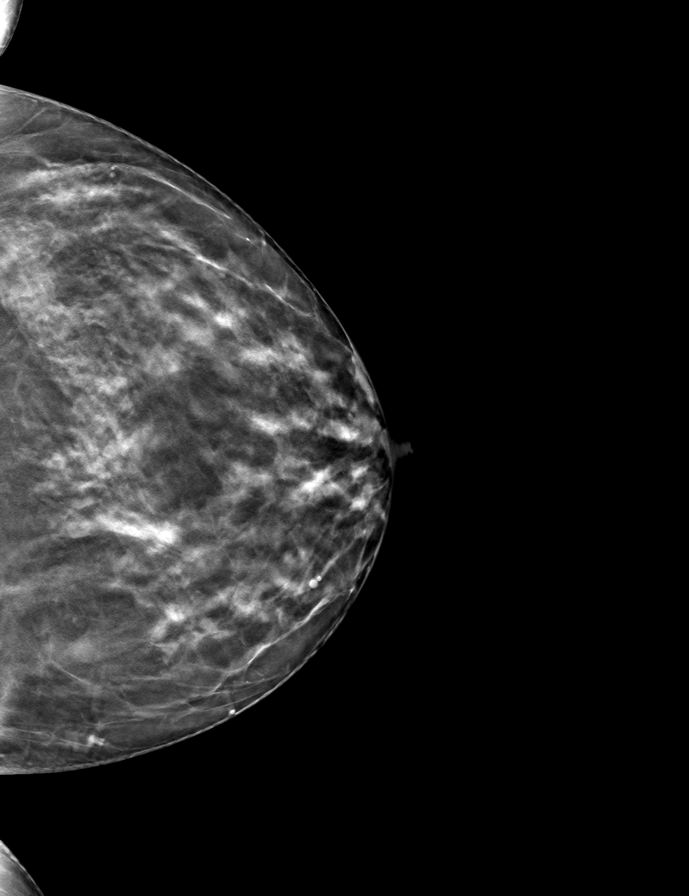

[R CC tomo · tomo slice 34/67.0]
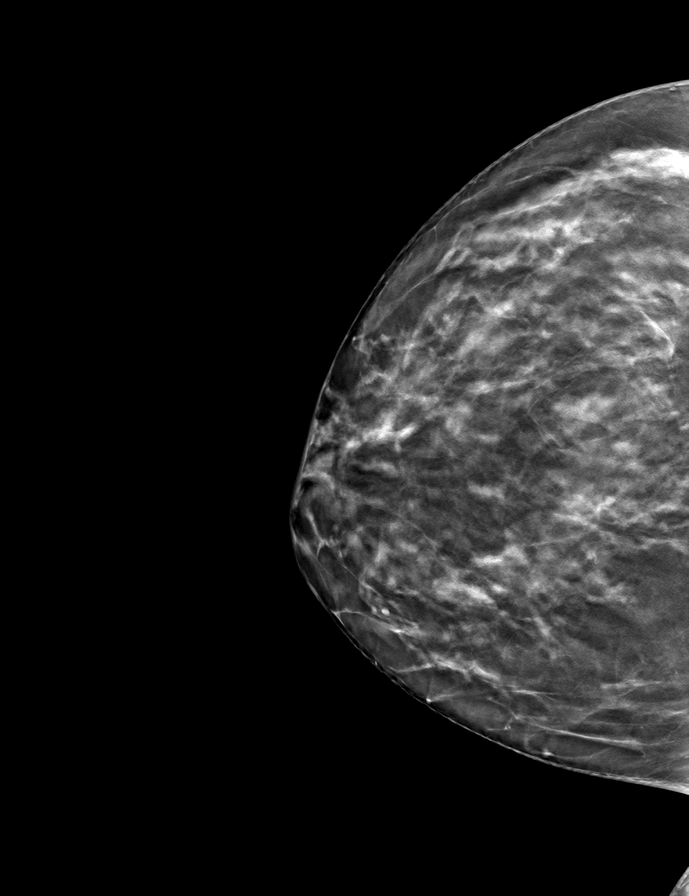

[9 of 24 positions shown; findings below may reference images not displayed]

ACR Breast Density Category c: The breast tissue is heterogeneously
dense, which may obscure small masses.
FINDINGS: There are no findings suspicious for malignancy.
IMPRESSION: No mammographic evidence of malignancy. A result letter of this
screening mammogram will be mailed directly to the patient.

RECOMMENDATION:
Screening mammogram in one year. (Code:Q3-W-BC3)

BI-RADS CATEGORY  1: Negative.

## 2022-07-26 ENCOUNTER — Other Ambulatory Visit: Payer: Self-pay | Admitting: Family Medicine

## 2022-07-26 DIAGNOSIS — Z1231 Encounter for screening mammogram for malignant neoplasm of breast: Secondary | ICD-10-CM

## 2022-08-27 ENCOUNTER — Ambulatory Visit
Admission: RE | Admit: 2022-08-27 | Discharge: 2022-08-27 | Disposition: A | Discharge status: Home / Self Care | Payer: BC Managed Care – PPO | Source: Ambulatory Visit | Attending: Family Medicine | Admitting: Family Medicine

## 2022-08-27 DIAGNOSIS — Z1231 Encounter for screening mammogram for malignant neoplasm of breast: Secondary | ICD-10-CM

## 2023-02-26 NOTE — Progress Notes (Signed)
57 y.o. [id number] Divorced Caucasian female here for annual exam.    No GYN concerns.  Denies vaginal bleeding or spotting.   Has constipation and diarrhea.  Considering restarting probiotics.  Has not seen her GI recently.   Being followed for elevated A1C. Last level in Epic 5.8 05/04/22.   Companion is doing tx for prostate CA. He is her long standing partner.   Dad passed in 2022 from CHF.  PCP:   Nancy Brewer - [facility]  Patient's last menstrual period was 06/22/2017 (approximate).           Sexually active: No.  The current method of family planning is post menopausal status.    Exercising: Yes.     gardening Smoker:  no  Health Maintenance: Pap:  11/22/19 neg: HR HPV neg, 09/18/16 neg History of abnormal Pap:  no MMG:  08/27/22 Breast Density Cat C, BI-RADS CAT 1 neg Colonoscopy:  10/05/19 normal per pt, next 10 yrs. BMD:   n/a  Result  n/a TDaP:  11/04/19 Gardasil:   no HIV: 08/25/19 NR Hep C: 08/25/19 Neg Screening Labs:  PCP   reports that she has never smoked. She has never used smokeless tobacco. She reports current alcohol use of about 5.0 - 7.0 standard drinks of alcohol per week. She reports that she does not use drugs.  Past Medical History:  Diagnosis Date   Blood transfusion without reported diagnosis 04/2010   Diverticulitis    Environmental allergies    Hypertension    Migraine headache with aura    OAB (overactive bladder)    Tachycardia     Past Surgical History:  Procedure Laterality Date   CESAREAN SECTION  2011   LASIK Bilateral 2018   NASAL SINUS SURGERY      Current Outpatient Medications  Medication Sig Dispense Refill   amLODipine (NORVASC) 5 MG tablet Take by mouth.     baclofen (LIORESAL) 10 MG tablet Take by mouth.     buPROPion (WELLBUTRIN XL) 300 MG 24 hr tablet Take 1 tablet by mouth daily.     metoprolol succinate (TOPROL-XL) 25 MG 24 hr tablet Take 1 tablet by mouth daily.     NURTEC 75 MG TBDP Take by mouth.     zonisamide  (ZONEGRAN) 100 MG capsule Take 100 mg by mouth 2 (two) times daily.     No current facility-administered medications for this visit.    Family History  Problem Relation Age of Onset   Cancer Mother        uterine cancer   Allergic rhinitis Mother    Asthma Mother    Eczema Mother    Diabetes Father        Type 2   Cancer Father        metastatic lung   Prostate cancer Father    Cancer - Other Father        Squamous Cell of mouth x2   Heart disease Father        open heart hurgery 2017   Heart attack Father    Myelodysplastic syndrome Father    Allergic rhinitis Father    Osteoporosis Maternal Grandmother    Lung cancer Maternal Grandfather    Osteoporosis Paternal Grandmother    Other Other        Dad with PDL1 Receptor, which inhibits T-cells (per pt.)    Review of Systems  All other systems reviewed and are negative.   Exam:   BP 122/78 (BP Location: Left  Arm, Patient Position: Sitting, Cuff Size: Normal)   Pulse 67   Ht 5' 2.5" (1.588 m)   Wt 136 lb (61.7 kg)   LMP 06/22/2017 (Approximate)   SpO2 99%   BMI 24.48 kg/m     General appearance: alert, cooperative and appears stated age Head: normocephalic, without obvious abnormality, atraumatic Neck: no adenopathy, supple, symmetrical, trachea midline and thyroid normal to inspection and palpation Lungs: clear to auscultation bilaterally Breasts: normal appearance, no masses or tenderness, No nipple retraction or dimpling, No nipple discharge or bleeding, No axillary adenopathy Heart: regular rate and rhythm Abdomen: soft, non-tender; no masses, no organomegaly Extremities: extremities normal, atraumatic, no cyanosis or edema Skin: skin color, texture, turgor normal. No rashes or lesions Lymph nodes: cervical, supraclavicular, and axillary nodes normal. Neurologic: grossly normal  Pelvic: External genitalia:  no lesions              No abnormal inguinal nodes palpated.              Urethra:  normal appearing  urethra with no masses, tenderness or lesions              Bartholins and Skenes: normal                 Vagina: normal appearing vagina with normal color and discharge, no lesions              Cervix: no lesions              Pap taken: no Bimanual Exam:  Uterus:  normal size, contour, position, consistency, mobility, non-tender              Adnexa: no mass, fullness, tenderness              Rectal exam: yes.  Confirms.              Anus:  normal sphincter tone, no lesions  Chaperone was present for exam:  Warren Lacy, CMA  Assessment:   Well woman visit with gynecologic exam. FH uterine cancer.  Hx overactive bladder.    Hx IBS.  Hx migraine with aura.   Plan: Mammogram screening discussed. Self breast awareness reviewed. Pap and HR HPV 2026. Guidelines for Calcium, Vitamin D, regular exercise program including cardiovascular and weight bearing exercise.   Follow up annually and prn.

## 2023-03-12 ENCOUNTER — Encounter: Payer: Self-pay | Admitting: Obstetrics and Gynecology

## 2023-03-12 ENCOUNTER — Ambulatory Visit (INDEPENDENT_AMBULATORY_CARE_PROVIDER_SITE_OTHER): Payer: No Typology Code available for payment source | Admitting: Obstetrics and Gynecology

## 2023-03-12 VITALS — BP 122/78 | HR 67 | Ht 62.5 in | Wt 136.0 lb

## 2023-03-12 DIAGNOSIS — Z01419 Encounter for gynecological examination (general) (routine) without abnormal findings: Secondary | ICD-10-CM

## 2023-03-12 NOTE — Patient Instructions (Signed)

## 2023-06-04 ENCOUNTER — Encounter: Payer: Self-pay | Admitting: Psychiatry

## 2023-06-04 ENCOUNTER — Ambulatory Visit: Payer: No Typology Code available for payment source | Admitting: Psychiatry

## 2023-06-04 VITALS — BP 106/68 | HR 70 | Ht 63.0 in | Wt 135.8 lb

## 2023-06-04 DIAGNOSIS — G43119 Migraine with aura, intractable, without status migrainosus: Secondary | ICD-10-CM | POA: Diagnosis not present

## 2023-06-04 MED ORDER — NURTEC 75 MG PO TBDP: 75.0000 mg | ORAL_TABLET | ORAL | 6 refills | Status: DC

## 2023-06-04 MED ORDER — ZONISAMIDE 100 MG PO CAPS: 100.0000 mg | ORAL_CAPSULE | Freq: Two times a day (BID) | ORAL | 6 refills | Status: DC

## 2023-06-04 MED ORDER — BUPROPION HCL ER (XL) 300 MG PO TB24: 300.0000 mg | ORAL_TABLET | Freq: Every day | ORAL | 6 refills | Status: DC

## 2023-06-04 MED ORDER — BACLOFEN 10 MG PO TABS: 10.0000 mg | ORAL_TABLET | Freq: Three times a day (TID) | ORAL | 6 refills | Status: DC | PRN

## 2023-06-04 NOTE — Patient Instructions (Addendum)
Consider Pristiq for headaches and anxiety.   Nancy Brewer is a daily headache preventive similar to Nurtec  Try decreasing Zonisamide to 100 mg daily for 2 weeks, then stopping.

## 2023-06-04 NOTE — Progress Notes (Signed)
Referring:  Monia Pouch., MD [address] [place],  [place] [address]  PCP: Johna Roles  Neurology was asked to evaluate Avva Winns, a 57 year old female for a chief complaint of headaches.  Our recommendations of care will be communicated by shared medical record.    CC:  headaches  History provided from self  HPI:  Medical co-morbidities: IBS, atypical facial pain  The patient presents for evaluation of headaches which began when she was 57 years old. She was diagnosed with migraines in the 90s. MRI brain at that time was normal. Migraines are associated with photophobia and phonophobia. She also has a visual aura of black spots as well as tingling in her hand.  She currently takes Zonisamide, Nurtec every other day, and Wellbutrin for migraine prevention. Previously took Cymbalta which worked well but caused weight gain. Takes baclofen as needed. She very rarely gets headaches on this regimen, but will occasionally get a visual aura and tingling in her hand. When she does get a migraine it is typically from missing doses of her medication. Migraines can last up to 7 days at a time.  Headache History: Onset: 57 years old Aura: black spots, left hand tingling Associated Symptoms:  Photophobia: yes  Phonophobia: yes  Nausea: no Worse with activity?: yes Duration of headaches: up to 1 week at a time   Current Treatment: Abortive Nurtec 75 mg Baclofen 10 mg  Preventative Wellbutrin 300 mg daily Zonisamide 200 mg daily Nurtec 75 mg every other day  Prior Therapies                                 Topamax Zonisamide Wellbutrin Cymbalta Amlodipine  metoprolol Botox Aimovig Nurtec every other day Trigger point injections  Rescue: Baclofen tizanidine Celebrex Imitrex Maxalt Ubrelvy Nurtec   LABS: CBC    Component Value Date/Time   WBC 29.6 (H) 05/24/2010 0015   RBC 3.78 (L) 05/24/2010 0015   HGB 12.1 05/24/2010 0015   HCT 36.1  05/24/2010 0015   PLT 337 05/24/2010 0015   MCV 95.6 05/24/2010 0015   MCH 32.1 05/24/2010 0015   MCHC 33.6 05/24/2010 0015   RDW 13.9 05/24/2010 0015       No data to display           IMAGING:  MRI brain 2003 unremarkable   Current Outpatient Medications on File Prior to Visit  Medication Sig Dispense Refill   amLODipine (NORVASC) 5 MG tablet Take by mouth.     metoprolol succinate (TOPROL-XL) 25 MG 24 hr tablet Take 1 tablet by mouth daily.     No current facility-administered medications on file prior to visit.     Allergies: No Known Allergies  Family History: Family History  Problem Relation Age of Onset   Cancer Mother        uterine cancer   Allergic rhinitis Mother    Asthma Mother    Eczema Mother    Diabetes Father        Type 2   Cancer Father        metastatic lung   Prostate cancer Father    Cancer - Other Father        Squamous Cell of mouth x2   Heart disease Father        open heart hurgery 2017   Heart attack Father    Myelodysplastic syndrome Father  Allergic rhinitis Father    Osteoporosis Maternal Grandmother    Lung cancer Maternal Grandfather    Osteoporosis Paternal Grandmother    Other Other        Dad with PDL1 Receptor, which inhibits T-cells (per pt.)     Past Medical History: Past Medical History:  Diagnosis Date   Blood transfusion without reported diagnosis 04/2010   Diverticulitis    Environmental allergies    Hypertension    Migraine headache with aura    OAB (overactive bladder)    Tachycardia     Past Surgical History Past Surgical History:  Procedure Laterality Date   CESAREAN SECTION  2011   LASIK Bilateral 2018   NASAL SINUS SURGERY      Social History: Social History   Tobacco Use   Smoking status: Never   Smokeless tobacco: Never  Vaping Use   Vaping status: Never Used  Substance Use Topics   Alcohol use: Yes    Alcohol/week: 5.0 - 7.0 standard drinks of alcohol    Types: 5 - 7 Glasses  of wine per week   Drug use: No    ROS: Negative for fevers, chills. Positive for headaches. All other systems reviewed and negative unless stated otherwise in HPI.   Physical Exam:   Vital Signs: BP 106/68 (BP Location: Right Arm, Patient Position: Sitting, Cuff Size: Normal)   Pulse 70   Ht 5\' 3"  (1.6 m)   Wt 135 lb 12.8 oz (61.6 kg)   LMP 06/22/2017 (Approximate)   BMI 24.06 kg/m  GENERAL: well appearing,in no acute distress,alert SKIN:  Color, texture, turgor normal. No rashes or lesions HEAD:  Normocephalic/atraumatic. CV:  RRR RESP: Normal respiratory effort  NEUROLOGICAL: Mental Status: Alert, oriented to person, place and time,Follows commands Cranial Nerves: PERRL, visual fields intact to confrontation, extraocular movements intact, facial sensation intact, no facial droop or ptosis, hearing grossly intact, no dysarthria, palate elevate symmetrically, tongue protrudes midline, shoulder shrug intact and symmetric Motor: muscle strength 5/5 both upper and lower extremities,no drift, normal tone Reflexes: 2+ throughout Sensation: intact to light touch all 4 extremities Coordination: Finger-to- nose-finger intact bilaterally Gait: normal-based   IMPRESSION: 57 year old female with a history of IBS, atypical facial pain who presents for evaluation of migraines. Her headaches are currently well-controlled with Zonisamide, Wellbutrin, and Nurtec every other day. She is interested in trying to simplify her medication regimen. Will attempt to wean Zonisamide. Otherwise will continue her current headache regimen.  PLAN: -Prevention: continue Wellbutrin 300 mg daily, Nurtec every other day. Decrease Zonisamide to 100 mg daily for 2 weeks, then if headaches are still controlled can stop the medication. -Rescue: Continue Nurtec and baclofen 10 mg PRN -Next steps: consider Pristiq, Qulipta   I spent a total of 28 minutes chart reviewing and counseling the patient. Headache  education was done. Discussed treatment options including preventive and acute medications. Discussed medication side effects, adverse reactions and drug interactions. Written educational materials and patient instructions outlining all of the above were given.  Follow-up: 6 months   Ocie Doyne, MD 06/04/2023   9:43 AM

## 2023-10-22 ENCOUNTER — Other Ambulatory Visit: Payer: Self-pay | Admitting: Family Medicine

## 2023-10-22 DIAGNOSIS — Z1231 Encounter for screening mammogram for malignant neoplasm of breast: Secondary | ICD-10-CM

## 2023-11-07 ENCOUNTER — Telehealth: Payer: Self-pay

## 2023-11-07 ENCOUNTER — Other Ambulatory Visit (HOSPITAL_COMMUNITY): Payer: Self-pay

## 2023-11-07 NOTE — Telephone Encounter (Signed)
Pharmacy Patient Advocate Encounter   Received notification from Fax that prior authorization for Nurtec 75mg  is required/requested.   Insurance verification completed.   The patient is insured through [employer] .   Per test claim: Refill too soon. PA is not needed at this time. Medication was filled 11/04/2023. Next eligible fill date is 11/30/2023.

## 2023-11-08 ENCOUNTER — Ambulatory Visit
Admission: RE | Admit: 2023-11-08 | Discharge: 2023-11-08 | Disposition: A | Discharge status: Home / Self Care | Payer: No Typology Code available for payment source | Source: Ambulatory Visit | Attending: Family Medicine | Admitting: Family Medicine

## 2023-11-08 DIAGNOSIS — Z1231 Encounter for screening mammogram for malignant neoplasm of breast: Secondary | ICD-10-CM

## 2023-12-05 NOTE — Progress Notes (Deleted)
 Referring:  No referring provider defined for this encounter.  PCP: Johna Roles    CC:  headaches  History provided from self  HPI:   Update 12/09/2023 JM: Patient returns for follow-up visit.  Zonisamide gradually tapered off at prior visit per patient request and continued on bupropion 300 mg daily and Nurtec every other day for prevention.      Consult visit 06/04/2023 Dr. Delena Bali: Medical co-morbidities: IBS, atypical facial pain  The patient presents for evaluation of headaches which began when she was 58 years old. She was diagnosed with migraines in the 90s. MRI brain at that time was normal. Migraines are associated with photophobia and phonophobia. She also has a visual aura of black spots as well as tingling in her hand.  She currently takes Zonisamide, Nurtec every other day, and Wellbutrin for migraine prevention. Previously took Cymbalta which worked well but caused weight gain. Takes baclofen as needed. She very rarely gets headaches on this regimen, but will occasionally get a visual aura and tingling in her hand. When she does get a migraine it is typically from missing doses of her medication. Migraines can last up to 7 days at a time.  Headache History: Onset: 58 years old. Aura: black spots, left hand tingling Associated Symptoms:  Photophobia: yes  Phonophobia: yes  Nausea: no Worse with activity?: yes Duration of headaches: up to 1 week at a time   Current Treatment: Abortive Nurtec 75 mg Baclofen 10 mg  Preventative Wellbutrin 300 mg daily Nurtec 75 mg every other day  Prior Therapies                                 Topamax Zonisamide Wellbutrin Cymbalta Amlodipine  metoprolol Botox Aimovig Nurtec every other day Trigger point injections  Rescue: Baclofen tizanidine Celebrex Imitrex Maxalt Ubrelvy Nurtec   LABS: CBC    Component Value Date/Time   WBC 29.6 (H) 05/24/2010 0015   RBC 3.78 (L) 05/24/2010 0015   HGB 12.1  05/24/2010 0015   HCT 36.1 05/24/2010 0015   PLT 337 05/24/2010 0015   MCV 95.6 05/24/2010 0015   MCH 32.1 05/24/2010 0015   MCHC 33.6 05/24/2010 0015   RDW 13.9 05/24/2010 0015       No data to display           IMAGING:  MRI brain 2003 unremarkable   Current Outpatient Medications on File Prior to Visit  Medication Sig Dispense Refill   amLODipine (NORVASC) 5 MG tablet Take by mouth.     baclofen (LIORESAL) 10 MG tablet Take 1 tablet (10 mg total) by mouth 3 (three) times daily as needed for muscle spasms. 60 each 6   buPROPion (WELLBUTRIN XL) 300 MG 24 hr tablet Take 1 tablet (300 mg total) by mouth daily. 30 tablet 6   metoprolol succinate (TOPROL-XL) 25 MG 24 hr tablet Take 1 tablet by mouth daily.     NURTEC 75 MG TBDP Take 1 tablet (75 mg total) by mouth every other day. 16 tablet 6   zonisamide (ZONEGRAN) 100 MG capsule Take 1 capsule (100 mg total) by mouth 2 (two) times daily. 60 capsule 6   No current facility-administered medications on file prior to visit.     Allergies: No Known Allergies  Family History: Family History  Problem Relation Age of Onset   Cancer Mother        uterine cancer  Allergic rhinitis Mother    Asthma Mother    Eczema Mother    Diabetes Father        Type 2   Cancer Father        metastatic lung   Prostate cancer Father    Cancer - Other Father        Squamous Cell of mouth x2   Heart disease Father        open heart hurgery 2017   Heart attack Father    Myelodysplastic syndrome Father    Allergic rhinitis Father    Osteoporosis Maternal Grandmother    Lung cancer Maternal Grandfather    Osteoporosis Paternal Grandmother    Other Other        Dad with PDL1 Receptor, which inhibits T-cells (per pt.)     Past Medical History: Past Medical History:  Diagnosis Date   Blood transfusion without reported diagnosis 04/2010   Diverticulitis    Environmental allergies    Hypertension    Migraine headache with aura     OAB (overactive bladder)    Tachycardia     Past Surgical History Past Surgical History:  Procedure Laterality Date   CESAREAN SECTION  2011   LASIK Bilateral 2018   NASAL SINUS SURGERY      Social History: Social History   Tobacco Use   Smoking status: Never   Smokeless tobacco: Never  Vaping Use   Vaping status: Never Used  Substance Use Topics   Alcohol use: Yes    Alcohol/week: 5.0 - 7.0 standard drinks of alcohol    Types: 5 - 7 Glasses of wine per week   Drug use: No    ROS: Positive for those listed in HPI. All other systems reviewed and negative unless stated otherwise in HPI.   Physical Exam:   Vital Signs: LMP 06/22/2017 (Approximate)  GENERAL: well appearing,in no acute distress,alert SKIN:  Color, texture, turgor normal. No rashes or lesions HEAD:  Normocephalic/atraumatic. CV:  RRR RESP: Normal respiratory effort  NEUROLOGICAL: Mental Status: Alert, oriented to person, place and time,Follows commands Cranial Nerves: PERRL, visual fields intact to confrontation, extraocular movements intact, facial sensation intact, no facial droop or ptosis, hearing grossly intact, no dysarthria, palate elevate symmetrically, tongue protrudes midline, shoulder shrug intact and symmetric Motor: muscle strength 5/5 both upper and lower extremities,no drift, normal tone Reflexes: 2+ throughout Sensation: intact to light touch all 4 extremities Coordination: Finger-to- nose-finger intact bilaterally Gait: normal-based   IMPRESSION: 58 year old female with a history of IBS, atypical facial pain who returns for follow-up of migraines. Her headaches are currently well-controlled with Zonisamide, Wellbutrin, and Nurtec every other day. She is interested in trying to simplify her medication regimen. Will attempt to wean Zonisamide. Otherwise will continue her current headache regimen.   PLAN: -Prevention: continue Wellbutrin 300 mg daily, Nurtec every other day. Decrease  Zonisamide to 100 mg daily for 2 weeks, then if headaches are still controlled can stop the medication. -Rescue: Continue Nurtec and baclofen 10 mg PRN -Next steps: consider Pristiq, Qulipta       I spent *** minutes of face-to-face and non-face-to-face time with patient.  This included previsit chart review, lab review, study review, order entry, electronic health record documentation, patient education and discussion regarding above diagnoses and treatment plan and answered all other questions to patient's satisfaction  Ihor Austin, [facility]  [facility] [address] [place], [place] [address]  Phone [phone] Fax [phone] Note: This document  was prepared with digital dictation and possible smart phrase technology. Any transcriptional errors that result from this process are unintentional.

## 2023-12-09 ENCOUNTER — Ambulatory Visit: Payer: No Typology Code available for payment source | Admitting: Adult Health

## 2024-01-08 ENCOUNTER — Ambulatory Visit: Payer: No Typology Code available for payment source | Admitting: Adult Health

## 2024-01-08 ENCOUNTER — Encounter: Payer: Self-pay | Admitting: Adult Health

## 2024-01-08 VITALS — BP 111/73 | HR 71 | Ht 63.0 in | Wt 143.0 lb

## 2024-01-08 DIAGNOSIS — G43119 Migraine with aura, intractable, without status migrainosus: Secondary | ICD-10-CM | POA: Diagnosis not present

## 2024-01-08 MED ORDER — NURTEC 75 MG PO TBDP: 75.0000 mg | ORAL_TABLET | ORAL | 11 refills | Status: AC

## 2024-01-08 MED ORDER — BUPROPION HCL ER (XL) 300 MG PO TB24: 300.0000 mg | ORAL_TABLET | Freq: Every day | ORAL | 3 refills | Status: AC

## 2024-01-08 MED ORDER — BACLOFEN 10 MG PO TABS: 10.0000 mg | ORAL_TABLET | Freq: Three times a day (TID) | ORAL | 11 refills | Status: AC | PRN

## 2024-01-08 MED ORDER — ZONISAMIDE 100 MG PO CAPS: 200.0000 mg | ORAL_CAPSULE | Freq: Every evening | ORAL | 3 refills | Status: DC

## 2024-01-08 NOTE — Progress Notes (Signed)
 Referring:  No referring provider defined for this encounter.  PCP: Johna Roles    CC:  headaches  History provided from self  HPI:   Update 12/09/2023 JM: Patient returns for follow-up visit.  Reports migraines remain well-controlled, currently has about 2/month but more consistent with stiff neck and left hand tingling, has not experienced any headache recently.  Will usually experience a headache if she misses the medication.  She attempted to discontinue zonisamide after prior visit but experienced increased migraines therefore restarted currently taking 200 mg nightly, also remains on bupropion 300 mg daily and Nurtec every other day.  Will use baclofen as needed.  No questions or concerns at this time.     History provided for reference purposes only Consult visit 06/04/2023 Dr. Delena Bali: Medical co-morbidities: IBS, atypical facial pain  The patient presents for evaluation of headaches which began when she was 58 years old. She was diagnosed with migraines in the 90s. MRI brain at that time was normal. Migraines are associated with photophobia and phonophobia. She also has a visual aura of black spots as well as tingling in her hand.  She currently takes Zonisamide, Nurtec every other day, and Wellbutrin for migraine prevention. Previously took Cymbalta which worked well but caused weight gain. Takes baclofen as needed. She very rarely gets headaches on this regimen, but will occasionally get a visual aura and tingling in her hand. When she does get a migraine it is typically from missing doses of her medication. Migraines can last up to 7 days at a time.  Headache History: Onset: 58 years old Aura: black spots, left hand tingling Associated Symptoms:  Photophobia: yes  Phonophobia: yes  Nausea: no Worse with activity?: yes Duration of headaches: up to 1 week at a time   Current Treatment: Abortive Nurtec 75 mg Baclofen 10 mg  Preventative Wellbutrin 300 mg  daily Nurtec 75 mg every other day Zonisamide 200 mg nightly  Prior Therapies                                 Topamax Zonisamide Wellbutrin Cymbalta Amlodipine  metoprolol Botox Aimovig Nurtec every other day Trigger point injections  Rescue: Baclofen tizanidine Celebrex Imitrex Maxalt Ubrelvy Nurtec   LABS: CBC    Component Value Date/Time   WBC 29.6 (H) 05/24/2010 0015   RBC 3.78 (L) 05/24/2010 0015   HGB 12.1 05/24/2010 0015   HCT 36.1 05/24/2010 0015   PLT 337 05/24/2010 0015   MCV 95.6 05/24/2010 0015   MCH 32.1 05/24/2010 0015   MCHC 33.6 05/24/2010 0015   RDW 13.9 05/24/2010 0015       No data to display           IMAGING:  MRI brain 2003 unremarkable   Current Outpatient Medications on File Prior to Visit  Medication Sig Dispense Refill   amLODipine (NORVASC) 5 MG tablet Take by mouth.     metoprolol succinate (TOPROL-XL) 25 MG 24 hr tablet Take 1 tablet by mouth daily.     No current facility-administered medications on file prior to visit.     Allergies: No Known Allergies  Family History: Family History  Problem Relation Age of Onset   Cancer Mother        uterine cancer   Allergic rhinitis Mother    Asthma Mother    Eczema Mother    Diabetes Father  Type 2   Cancer Father        metastatic lung   Prostate cancer Father    Cancer - Other Father        Squamous Cell of mouth x2   Heart disease Father        open heart hurgery 2017   Heart attack Father    Myelodysplastic syndrome Father    Allergic rhinitis Father    Osteoporosis Maternal Grandmother    Lung cancer Maternal Grandfather    Osteoporosis Paternal Grandmother    Other Other        Dad with PDL1 Receptor, which inhibits T-cells (per pt.)     Past Medical History: Past Medical History:  Diagnosis Date   Blood transfusion without reported diagnosis 04/2010   Diverticulitis    Environmental allergies    Hypertension    Migraine headache with  aura    OAB (overactive bladder)    Tachycardia     Past Surgical History Past Surgical History:  Procedure Laterality Date   CESAREAN SECTION  2011   LASIK Bilateral 2018   NASAL SINUS SURGERY      Social History: Social History   Tobacco Use   Smoking status: Never   Smokeless tobacco: Never  Vaping Use   Vaping status: Never Used  Substance Use Topics   Alcohol use: Yes    Alcohol/week: 5.0 - 7.0 standard drinks of alcohol    Types: 5 - 7 Glasses of wine per week   Drug use: No    ROS: Positive for those listed in HPI. All other systems reviewed and negative unless stated otherwise in HPI.   Physical Exam:   Vital Signs: BP 111/73   Pulse 71   Ht 5\' 3"  (1.6 m)   Wt 143 lb (64.9 kg)   LMP 06/22/2017 (Approximate)   BMI 25.33 kg/m  GENERAL: well appearing, very pleasant middle-age Caucasian female, in no acute distress,alert SKIN:  Color, texture, turgor normal. No rashes or lesions HEAD:  Normocephalic/atraumatic. CV:  RRR RESP: Normal respiratory effort  NEUROLOGICAL: Mental Status: Alert, oriented to person, place and time,Follows commands Cranial Nerves: PERRL, visual fields intact to confrontation, extraocular movements intact, facial sensation intact, no facial droop or ptosis, hearing grossly intact, no dysarthria, palate elevate symmetrically, tongue protrudes midline, shoulder shrug intact and symmetric Motor: muscle strength 5/5 both upper and lower extremities,no drift, normal tone Reflexes: 2+ throughout Sensation: intact to light touch all 4 extremities Coordination: Finger-to- nose-finger intact bilaterally Gait: normal-based      IMPRESSION: 58 year old female with a history of IBS, atypical facial pain who returns for follow-up of migraines. Her headaches are currently well-controlled with Zonisamide, Wellbutrin, and Nurtec every other day.     PLAN: -Prevention: Continue zonisamide 200 mg nightly, Wellbutrin 300 mg daily, and Nurtec  every other day.  Attempted to discontinue zonisamide but had worsening migraines. -Rescue: Continue Nurtec and baclofen 10 mg PRN -Next steps: consider Pristiq, Qulipta    Follow-up in 1 year (prefers in office visit) or call earlier if needed      I spent 15 minutes of face-to-face and non-face-to-face time with patient.  This included previsit chart review, lab review, study review, order entry, electronic health record documentation, patient education and discussion regarding above diagnoses and treatment plan and answered all other questions to patient's satisfaction  Ihor Austin, [facility]  [facility] [address] [place], [place] [address]  Phone [phone] Fax [phone] Note: This  document was prepared with digital dictation and possible smart phrase technology. Any transcriptional errors that result from this process are unintentional.

## 2024-01-08 NOTE — Patient Instructions (Addendum)
 Your Plan:  Continue current treatment regimen   All refills have been sent to your pharmacy   Please call with any worsening migraine headaches     Follow up in 1 year or call earlier if needed     Thank you for coming to see [place] at [facility]. I hope we have been able to provide you high quality care today.  You may receive a patient satisfaction survey over the next few weeks. We would appreciate your feedback and comments so that we may continue to improve ourselves and the health of our patients.

## 2024-02-19 ENCOUNTER — Ambulatory Visit: Admitting: Radiology

## 2024-02-19 VITALS — BP 102/64 | HR 72 | Temp 98.3°F | Wt 138.6 lb

## 2024-02-19 DIAGNOSIS — R3915 Urgency of urination: Secondary | ICD-10-CM

## 2024-02-19 LAB — NO CULTURE INDICATED

## 2024-02-19 LAB — URINALYSIS, COMPLETE W/RFL CULTURE
Bacteria, UA: NONE SEEN /HPF
Bilirubin Urine: NEGATIVE
Glucose, UA: NEGATIVE
Hgb urine dipstick: NEGATIVE
Hyaline Cast: NONE SEEN /LPF
Ketones, ur: NEGATIVE
Leukocyte Esterase: NEGATIVE
Nitrites, Initial: NEGATIVE
Protein, ur: NEGATIVE
RBC / HPF: NONE SEEN /HPF (ref 0–2)
Specific Gravity, Urine: 1.01 (ref 1.001–1.035)
WBC, UA: NONE SEEN /HPF (ref 0–5)
pH: 7 (ref 5.0–8.0)

## 2024-02-19 NOTE — Progress Notes (Signed)
      SUBJECTIVE: Nancy Brewer is a 58 y.o. female Had virtual appointment 02/14/24 and was treated for a UTI with Macrobid  BID x's 5 days. Completed antibiotics yesterday and still complains of bladder pressure, urgency, voiding small amounts, frequency, no dysuria today. Denies vaginal symptoms.   No Known Allergies  Current Outpatient Medications on File Prior to Visit  Medication Sig Dispense Refill   amLODipine (NORVASC) 5 MG tablet Take by mouth.     baclofen  (LIORESAL ) 10 MG tablet Take 1 tablet (10 mg total) by mouth 3 (three) times daily as needed for muscle spasms. 60 each 11   buPROPion  (WELLBUTRIN  XL) 300 MG 24 hr tablet Take 1 tablet (300 mg total) by mouth daily. 90 tablet 3   metoprolol succinate (TOPROL-XL) 25 MG 24 hr tablet Take 1 tablet by mouth daily.     NURTEC 75 MG TBDP Take 1 tablet (75 mg total) by mouth every other day. 16 tablet 11   Probiotic Product (PROBIOTIC PO) Take by mouth.     zonisamide  (ZONEGRAN ) 100 MG capsule Take 2 capsules (200 mg total) by mouth at bedtime. 180 capsule 3   No current facility-administered medications on file prior to visit.    Past Medical History:  Diagnosis Date   Blood transfusion without reported diagnosis 04/2010   Diverticulitis    Environmental allergies    Hypertension    Migraine headache with aura    OAB (overactive bladder)    Tachycardia      OBJECTIVE: Appears well, in no apparent distress.  Vital signs are normal. The abdomen is soft without tenderness, guarding, mass, rebound or organomegaly. No CVA tenderness or inguinal adenopathy noted. Urine dipstick shows negative for all components.  Micro exam: negative for WBC's or RBC's.    Blood pressure 102/64, pulse 72, temperature 98.3 F (36.8 C), temperature source Oral, weight 138 lb 9.6 oz (62.9 kg), last menstrual period 06/22/2017, SpO2 99%.    Chaperone offered and declined for exam.  ASSESSMENT/PLAN: 1. Urinary urgency (Primary) Reassured u/a  negative. May try AZO for 1-2 days If no improvement by next week may leave repeat urine for culture - Urinalysis,Complete w/RFL Culture    Will send urine culture and sensitivity.  Treatment per orders - also push fluids, avoid bladder irritants. Instructed she may use Pyridium  OTC prn. Call or return to clinic prn if these symptoms worsen or fail to improve as anticipated.   Shawntelle Ungar B, NP 11:11 AM

## 2024-03-20 NOTE — Progress Notes (Signed)
 58 y.o. G66P1001 Divorced Caucasian female here for annual exam.    Occasional right sided lower abdominal pain a couple of weeks ago.  Lasted for a couple of days and resolved.  No vaginal bleeding.  Has constipation and then diarrhea occurs.  Uses a probiotic.  No blood in her urine.   Treated for UTI a couple of weeks ago at urgent care.  Follow up here in office 02/19/23 showed a negative urinalysis, so no UC was performed.   PCP: Kathlene Paradise   Patient's last menstrual period was 06/22/2017 (approximate).           Sexually active: No.  The current method of family planning is none.    Menopausal hormone therapy:  n/a Exercising: Yes.    Gardening  Smoker:  no  OB History  Gravida Para Term Preterm AB Living  1 1 1  0 0 1  SAB IAB Ectopic Multiple Live Births  0 0 0 0 1    # Outcome Date GA Lbr Len/2nd Weight Sex Type Anes PTL Lv  1 Term 2011 [redacted]w[redacted]d  7 lb 11 oz (3.487 kg) M CS-Unspec   LIV    Obstetric Comments  Son is by sperm donor.     HEALTH MAINTENANCE: Last 2 paps:  11/04/19 neg HR HPV Neg  History of abnormal Pap or positive HPV:  no Mammogram:   11/08/23 Breast density Cat C, BIRADS Cat 1 neg  Colonoscopy:  10/05/19 - due in 2030. Bone Density:  n/a  Result  n/a   Immunization History  Administered Date(s) Administered   Influenza, Seasonal, Injecte, Preservative Fre 07/22/2016   Influenza,inj,Quad PF,6+ Mos 06/05/2019   Influenza-Unspecified 07/22/2016, 10/07/2017, 07/31/2018, 06/05/2019   MMR 10/22/2001   PFIZER(Purple Top)SARS-COV-2 Vaccination 01/14/2020, 02/12/2020   Tdap 10/22/2009, 11/04/2019   Tetanus 10/22/2001   Zoster Recombinant(Shingrix) 06/05/2019, 08/18/2019      reports that she has never smoked. She has never used smokeless tobacco. She reports current alcohol use of about 5.0 - 7.0 standard drinks of alcohol per week. She reports that she does not use drugs.  Past Medical History:  Diagnosis Date   Blood transfusion without  reported diagnosis 04/2010   Diverticulitis    Environmental allergies    Hypertension    Migraine headache with aura    OAB (overactive bladder)    Tachycardia     Past Surgical History:  Procedure Laterality Date   CESAREAN SECTION  2011   LASIK Bilateral 2018   NASAL SINUS SURGERY      Current Outpatient Medications  Medication Sig Dispense Refill   amLODipine (NORVASC) 5 MG tablet Take by mouth.     baclofen  (LIORESAL ) 10 MG tablet Take 1 tablet (10 mg total) by mouth 3 (three) times daily as needed for muscle spasms. 60 each 11   buPROPion  (WELLBUTRIN  XL) 300 MG 24 hr tablet Take 1 tablet (300 mg total) by mouth daily. 90 tablet 3   metoprolol succinate (TOPROL-XL) 25 MG 24 hr tablet Take 1 tablet by mouth daily.     NURTEC 75 MG TBDP Take 1 tablet (75 mg total) by mouth every other day. 16 tablet 11   Probiotic Product (PROBIOTIC PO) Take by mouth.     zonisamide  (ZONEGRAN ) 100 MG capsule Take 2 capsules (200 mg total) by mouth at bedtime. 180 capsule 3   No current facility-administered medications for this visit.    ALLERGIES: Patient has no known allergies.  Family History  Problem Relation Age  of Onset   Cancer Mother        uterine cancer   Allergic rhinitis Mother    Asthma Mother    Eczema Mother    Diabetes Father        Type 2   Cancer Father        metastatic lung   Prostate cancer Father    Cancer - Other Father        Squamous Cell of mouth x2   Heart disease Father        open heart hurgery 2017   Heart attack Father    Myelodysplastic syndrome Father    Allergic rhinitis Father    Osteoporosis Maternal Grandmother    Lung cancer Maternal Grandfather    Osteoporosis Paternal Grandmother    Other Other        Dad with PDL1 Receptor, which inhibits T-cells (per pt.)    Review of Systems  All other systems reviewed and are negative.   PHYSICAL EXAM:  BP 108/74 (BP Location: Left Arm, Patient Position: Sitting)   Pulse 78   Ht 5' 3.5"  (1.613 m)   Wt 139 lb (63 kg)   LMP 06/22/2017 (Approximate)   SpO2 98%   BMI 24.24 kg/m     General appearance: alert, cooperative and appears stated age Head: normocephalic, without obvious abnormality, atraumatic Neck: no adenopathy, supple, symmetrical, trachea midline and thyroid normal to inspection and palpation Lungs: clear to auscultation bilaterally Breasts: normal appearance, no masses or tenderness, No nipple retraction or dimpling, No nipple discharge or bleeding, No axillary adenopathy Heart: regular rate and rhythm Abdomen: soft, non-tender; no masses, no organomegaly Extremities: extremities normal, atraumatic, no cyanosis or edema Skin: skin color, texture, turgor normal. No rashes or lesions Lymph nodes: cervical, supraclavicular, and axillary nodes normal. Neurologic: grossly normal  Pelvic: External genitalia:  no lesions              No abnormal inguinal nodes palpated.              Urethra:  normal appearing urethra with no masses, tenderness or lesions              Bartholins and Skenes: normal                 Vagina: normal appearing vagina with normal color and discharge, no lesions              Cervix: no lesions              Pap taken: no Bimanual Exam:  Uterus:  normal size, contour, position, consistency, mobility, non-tender              Adnexa: no mass, fullness, tenderness              Rectal exam: yes.  Confirms.              Anus:  normal sphincter tone, no lesions  Chaperone was present for exam:  Cottie Diss, CMA  ASSESSMENT: Well woman visit with gynecologic exam. FH uterine cancer.  Hx overactive bladder.    Hx IBS.  RLQ pain resolved.  Hx migraine with aura.  PHQ-9: 0  PLAN: Mammogram screening discussed. Self breast awareness reviewed. Pap and HRV collected:  no.  Due in 2026.  Guidelines for Calcium, Vitamin D, regular exercise program including cardiovascular and weight bearing exercise. Medication refills:  NA Return for recurrent  RLQ pain.  Follow up:  yearly and prn.

## 2024-03-23 ENCOUNTER — Ambulatory Visit (INDEPENDENT_AMBULATORY_CARE_PROVIDER_SITE_OTHER): Payer: No Typology Code available for payment source | Admitting: Obstetrics and Gynecology

## 2024-03-23 ENCOUNTER — Encounter: Payer: Self-pay | Admitting: Obstetrics and Gynecology

## 2024-03-23 VITALS — BP 108/74 | HR 78 | Ht 63.5 in | Wt 139.0 lb

## 2024-03-23 DIAGNOSIS — Z01419 Encounter for gynecological examination (general) (routine) without abnormal findings: Secondary | ICD-10-CM

## 2024-03-23 DIAGNOSIS — Z1331 Encounter for screening for depression: Secondary | ICD-10-CM | POA: Diagnosis not present

## 2024-03-23 NOTE — Patient Instructions (Signed)
 Calcium in Foods Calcium is a mineral in the body. Of all the minerals in your body, you have the most calcium. Most of the body's calcium supply is stored in bones and teeth. Calcium helps many parts of the body work, including: Blood and blood vessels. Nerves. Hormones. Muscles. Bones and teeth. When your calcium stores are low, you may be at risk for low bone mass, bone loss, and broken bones. When you get enough calcium, it helps to support strong bones and teeth throughout your life. Calcium is especially important for: Children during growth spurts. Females during adolescence. Females who are pregnant or breastfeeding. Females after their menstrual cycle stops (postmenopausal). Females whose menstrual cycle has stopped because of an eating disorder or regular intense exercise. People who can't eat or digest dairy products. People who eat a vegan diet. Recommended daily amounts of calcium: Females (ages 87 to 55): 1,000 mg per day. Females (ages 35 and older): 1,200 mg per day. Males (ages 53 to 45): 1,000 mg per day. Males (ages 43 and older): 1,200 mg per day. Females (ages 88 to 28): 1,300 mg per day. Males (ages 41 to 56): 1,300 mg per day. General information Eat foods that are high in calcium. Try to get most of your calcium from food. Some people may benefit from taking calcium supplements. Check with your health care provider or an expert in healthy eating called a dietitian before starting any calcium supplements. Calcium supplements may interact with certain medicines. Too much calcium may cause other health problems, such as trouble pooping and kidney stones. For the body to absorb calcium, it needs vitamin D. Sources of vitamin D include: Skin exposure to direct sunlight. Foods, such as egg yolks, liver, mushrooms, saltwater fish, and fortified milk. Vitamin D supplements. Check with your provider or dietitian before starting any vitamin D supplements. The amount of  calcium that is absorbed in the body varies with type of food. Talk to a dietitian about what foods are best for you, especially if you are eat a vegan diet or don't eat dairy. What foods are high in calcium?  Foods that are high in calcium contain more than 100 milligrams per serving. Fruits Fortified orange juice or other fruit juice, 300 mg per 8 oz (237 mL) serving. Vegetables Collard greens, 260 mg per 1 cup (130 g) serving, cooked. Kale, 180 mg per 1 cup (118 g) serving, cooked. Bok choy, 180 mg per 1 cup (170 g) serving, cooked Grains Fortified frozen waffles, 200 mg in 2 waffles. Oatmeal, 180 mg in 1 cup (234 g) serving, cooked. Fortified white bread, 175 mg per slice. Meats and other proteins Sardines, canned with bones, 350 mg per 3.75 oz (92 g) serving. Salmon, canned with bones, 168 mg per 3 oz (85 g) serving. Canned shrimp, 125 mg per 3 oz (85 g) serving. Baked beans, 120 mg per 1 cup (266 g) serving. Tofu, firm, made with calcium sulfate, 861 mg per  cup (126 g) serving. Dairy Yogurt, plain, low-fat, 448 mg per 1 cup (245 g) serving Nonfat milk, 300 mg per 1 cup (245 g) serving. American cheese, 145 mg per 1 oz (21 g) serving or 1 slice. Cheddar cheese, 200 mg per 1 oz (28 g) serving or 1 slice. Cottage cheese 2%, 125 mg per  cup (113 g) serving. Fortified soy, rice, or almond milk, 300 mg per 1 cup (237 mL) serving. Mozzarella, part skim, 210 mg per 1 oz (21 g) serving. The items listed  above may not be a complete list of foods high in calcium. Actual amounts of calcium may be different depending on processing. Contact a dietitian for more information. What foods are lower in calcium? Foods that are lower in calcium contain 50 mg or less per serving. Fruits Apple, 1 medium, about 6 mg. Banana, 1 medium, about 12 mg. Vegetables Lettuce, 19 mg per 1 cup (35 g) serving. Tomato, 1 small, about 11 mg. Grains Rice, white, 8 mg per  cup (79 g) serving. Boiled  potatoes, 14 mg per 1 cup (160 g) serving. White bread, 6 mg per slice. Meats and other proteins Egg, 24 mg per 1 egg (50 g). Red meat, 7 mg per 4 oz (80 g) serving. Chicken, 17 mg per 4 oz (113 g) serving. Fish, cod, or trout, 20 mg per 4 oz (140 g) serving. Dairy Cream cheese, regular, 14 mg per 1 Tbsp (15 g) serving. Brie cheese, 50 mg per 1 oz (32 g) serving. The items listed above may not be a complete list of foods lower in calcium. Actual amounts of calcium may be different depending on processing. Contact a dietitian for more information. This information is not intended to replace advice given to you by your health care provider. Make sure you discuss any questions you have with your health care provider. Document Revised: 07/06/2023 Document Reviewed: 07/06/2023 Elsevier Patient Education  2024 [employer] Inc.  EXERCISE AND DIET:  We recommended that you start or continue a regular exercise program for good health. Regular exercise means any activity that makes your heart beat faster and makes you sweat.  We recommend exercising at least 30 minutes per day at least 3 days a week, preferably 4 or 5.  We also recommend a diet low in fat and sugar.  Inactivity, poor dietary choices and obesity can cause diabetes, heart attack, stroke, and kidney damage, among others.    ALCOHOL AND SMOKING:  Women should limit their alcohol intake to no more than 7 drinks/beers/glasses of wine (combined, not each!) per week. Moderation of alcohol intake to this level decreases your risk of breast cancer and liver damage. And of course, no recreational drugs are part of a healthy lifestyle.  And absolutely no smoking or even second hand smoke. Most people know smoking can cause heart and lung diseases, but did you know it also contributes to weakening of your bones? Aging of your skin?  Yellowing of your teeth and nails?  CALCIUM AND VITAMIN D:  Adequate intake of calcium and Vitamin D are recommended.  The  recommendations for exact amounts of these supplements seem to change often, but generally speaking 600 mg of calcium (either carbonate or citrate) and 800 units of Vitamin D per day seems prudent. Certain women may benefit from higher intake of Vitamin D.  If you are among these women, your doctor will have told you during your visit.    PAP SMEARS:  Pap smears, to check for cervical cancer or precancers,  have traditionally been done yearly, although recent scientific advances have shown that most women can have pap smears less often.  However, every woman still should have a physical exam from her gynecologist every year. It will include a breast check, inspection of the vulva and vagina to check for abnormal growths or skin changes, a visual exam of the cervix, and then an exam to evaluate the size and shape of the uterus and ovaries.  And after 56 years of age, a rectal exam  is indicated to check for rectal cancers. We will also provide age appropriate advice regarding health maintenance, like when you should have certain vaccines, screening for sexually transmitted diseases, bone density testing, colonoscopy, mammograms, etc.   MAMMOGRAMS:  All women over 11 years old should have a yearly mammogram. Many facilities now offer a "3D" mammogram, which may cost around $50 extra out of pocket. If possible,  we recommend you accept the option to have the 3D mammogram performed.  It both reduces the number of women who will be called back for extra views which then turn out to be normal, and it is better than the routine mammogram at detecting truly abnormal areas.    COLONOSCOPY:  Colonoscopy to screen for colon cancer is recommended for all women at age 8.  We know, you hate the idea of the prep.  We agree, BUT, having colon cancer and not knowing it is worse!!  Colon cancer so often starts as a polyp that can be seen and removed at colonscopy, which can quite literally save your life!  And if your first  colonoscopy is normal and you have no family history of colon cancer, most women don't have to have it again for 10 years.  Once every ten years, you can do something that may end up saving your life, right?  We will be happy to help you get it scheduled when you are ready.  Be sure to check your insurance coverage so you understand how much it will cost.  It may be covered as a preventative service at no cost, but you should check your particular policy.

## 2024-06-10 ENCOUNTER — Other Ambulatory Visit: Payer: Self-pay

## 2024-06-10 MED ORDER — ZONISAMIDE 100 MG PO CAPS
200.0000 mg | ORAL_CAPSULE | Freq: Every evening | ORAL | 3 refills | Status: AC
Start: 2024-06-10 — End: ?

## 2024-11-04 ENCOUNTER — Other Ambulatory Visit: Payer: Self-pay | Admitting: Obstetrics and Gynecology

## 2024-11-04 DIAGNOSIS — Z1231 Encounter for screening mammogram for malignant neoplasm of breast: Secondary | ICD-10-CM

## 2024-11-13 ENCOUNTER — Ambulatory Visit

## 2024-11-20 ENCOUNTER — Ambulatory Visit

## 2024-11-27 ENCOUNTER — Ambulatory Visit: Admission: RE | Admit: 2024-11-27 | Source: Ambulatory Visit

## 2024-11-27 DIAGNOSIS — Z1231 Encounter for screening mammogram for malignant neoplasm of breast: Secondary | ICD-10-CM

## 2025-01-07 ENCOUNTER — Ambulatory Visit: Admitting: Adult Health
